# Patient Record
Sex: Female | Born: 1945 | Race: White | Hispanic: No | Smoking: Former smoker
Health system: Southern US, Community
[De-identification: ages and names within clinical notes are randomized; demographics above are authoritative.]

## PROBLEM LIST (undated history)

## (undated) DIAGNOSIS — F329 Major depressive disorder, single episode, unspecified: Secondary | ICD-10-CM

## (undated) DIAGNOSIS — M8580 Other specified disorders of bone density and structure, unspecified site: Secondary | ICD-10-CM

## (undated) DIAGNOSIS — J309 Allergic rhinitis, unspecified: Secondary | ICD-10-CM

## (undated) DIAGNOSIS — J449 Chronic obstructive pulmonary disease, unspecified: Secondary | ICD-10-CM

## (undated) DIAGNOSIS — E785 Hyperlipidemia, unspecified: Secondary | ICD-10-CM

## (undated) DIAGNOSIS — M797 Fibromyalgia: Secondary | ICD-10-CM

## (undated) DIAGNOSIS — N189 Chronic kidney disease, unspecified: Secondary | ICD-10-CM

## (undated) DIAGNOSIS — E559 Vitamin D deficiency, unspecified: Secondary | ICD-10-CM

## (undated) DIAGNOSIS — F32A Depression, unspecified: Secondary | ICD-10-CM

## (undated) DIAGNOSIS — B009 Herpesviral infection, unspecified: Secondary | ICD-10-CM

## (undated) HISTORY — PX: VAGINAL HYSTERECTOMY: SUR661

## (undated) HISTORY — DX: Other specified disorders of bone density and structure, unspecified site: M85.80

## (undated) HISTORY — DX: Chronic obstructive pulmonary disease, unspecified: J44.9

## (undated) HISTORY — DX: Chronic kidney disease, unspecified: N18.9

## (undated) HISTORY — DX: Depression, unspecified: F32.A

## (undated) HISTORY — DX: Major depressive disorder, single episode, unspecified: F32.9

## (undated) HISTORY — DX: Vitamin D deficiency, unspecified: E55.9

## (undated) HISTORY — PX: CHOLECYSTECTOMY: SHX55

## (undated) HISTORY — DX: Herpesviral infection, unspecified: B00.9

## (undated) HISTORY — PX: MOUTH SURGERY: SHX715

## (undated) HISTORY — DX: Allergic rhinitis, unspecified: J30.9

## (undated) HISTORY — DX: Hyperlipidemia, unspecified: E78.5

## (undated) HISTORY — PX: OTHER SURGICAL HISTORY: SHX169

---

## 1998-07-18 ENCOUNTER — Ambulatory Visit (HOSPITAL_COMMUNITY): Admission: RE | Admit: 1998-07-18 | Discharge: 1998-07-18 | Payer: Self-pay | Admitting: Gastroenterology

## 1998-07-22 ENCOUNTER — Ambulatory Visit (HOSPITAL_COMMUNITY): Admission: RE | Admit: 1998-07-22 | Discharge: 1998-07-22 | Payer: Self-pay | Admitting: Gastroenterology

## 1999-06-10 ENCOUNTER — Inpatient Hospital Stay (HOSPITAL_COMMUNITY): Admission: AD | Admit: 1999-06-10 | Discharge: 1999-06-12 | Payer: Self-pay | Admitting: Internal Medicine

## 1999-06-16 ENCOUNTER — Encounter: Payer: Self-pay | Admitting: Emergency Medicine

## 1999-06-16 ENCOUNTER — Emergency Department (HOSPITAL_COMMUNITY): Admission: EM | Admit: 1999-06-16 | Discharge: 1999-06-16 | Payer: Self-pay | Admitting: Emergency Medicine

## 1999-07-13 ENCOUNTER — Encounter: Payer: Self-pay | Admitting: Gastroenterology

## 1999-07-13 ENCOUNTER — Ambulatory Visit (HOSPITAL_COMMUNITY): Admission: RE | Admit: 1999-07-13 | Discharge: 1999-07-13 | Payer: Self-pay | Admitting: Gastroenterology

## 1999-08-13 ENCOUNTER — Encounter: Payer: Self-pay | Admitting: Emergency Medicine

## 1999-08-13 ENCOUNTER — Inpatient Hospital Stay (HOSPITAL_COMMUNITY): Admission: EM | Admit: 1999-08-13 | Discharge: 1999-08-15 | Payer: Self-pay | Admitting: *Deleted

## 1999-08-14 ENCOUNTER — Encounter: Payer: Self-pay | Admitting: Internal Medicine

## 1999-09-22 ENCOUNTER — Ambulatory Visit (HOSPITAL_COMMUNITY): Admission: RE | Admit: 1999-09-22 | Discharge: 1999-09-22 | Payer: Self-pay | Admitting: Internal Medicine

## 1999-09-22 ENCOUNTER — Encounter: Payer: Self-pay | Admitting: Internal Medicine

## 1999-09-25 ENCOUNTER — Encounter (HOSPITAL_BASED_OUTPATIENT_CLINIC_OR_DEPARTMENT_OTHER): Payer: Self-pay | Admitting: General Surgery

## 1999-09-25 ENCOUNTER — Ambulatory Visit (HOSPITAL_COMMUNITY): Admission: RE | Admit: 1999-09-25 | Discharge: 1999-09-26 | Payer: Self-pay | Admitting: General Surgery

## 2001-01-26 ENCOUNTER — Encounter: Admission: RE | Admit: 2001-01-26 | Discharge: 2001-01-26 | Payer: Self-pay | Admitting: Family Medicine

## 2001-01-26 ENCOUNTER — Encounter: Payer: Self-pay | Admitting: Family Medicine

## 2003-08-12 ENCOUNTER — Encounter: Payer: Self-pay | Admitting: Neurology

## 2003-08-12 ENCOUNTER — Encounter: Admission: RE | Admit: 2003-08-12 | Discharge: 2003-08-12 | Payer: Self-pay | Admitting: Neurology

## 2005-08-02 ENCOUNTER — Ambulatory Visit: Payer: Self-pay | Admitting: Internal Medicine

## 2005-08-20 ENCOUNTER — Ambulatory Visit: Payer: Self-pay | Admitting: Internal Medicine

## 2010-05-28 ENCOUNTER — Encounter: Admission: RE | Admit: 2010-05-28 | Discharge: 2010-05-28 | Payer: Self-pay | Admitting: Family Medicine

## 2010-05-29 ENCOUNTER — Ambulatory Visit: Admission: RE | Admit: 2010-05-29 | Discharge: 2010-05-29 | Payer: Self-pay | Admitting: Family Medicine

## 2010-07-23 ENCOUNTER — Encounter (INDEPENDENT_AMBULATORY_CARE_PROVIDER_SITE_OTHER): Payer: Self-pay | Admitting: *Deleted

## 2011-01-14 NOTE — Letter (Signed)
Summary: Colonoscopy Letter  [facility]  [address] [place], [place] [address]   Phone: [phone]  Fax: [phone]      July 23, 2010 MRN: [id number]   [facility] [address] [place], [place]  [address]   Dear Ms. Lard,   According to your medical record, it is time for you to schedule a Colonoscopy. The American Cancer Society recommends this procedure as a method to detect early colon cancer. Patients with a family history of colon cancer, or a personal history of colon polyps or inflammatory bowel disease are at increased risk.  This letter has beeen generated based on the recommendations made at the time of your procedure. If you feel that in your particular situation this may no longer apply, please contact our office.  Please call our office at [phone] to schedule this appointment or to update your records at your earliest convenience.  Thank you for cooperating with [place] to provide you with the very best care possible.   Sincerely,  Hedwig Morton. Juanda Chance, M.D.  [facility] [phone]

## 2012-04-18 ENCOUNTER — Other Ambulatory Visit: Payer: Self-pay | Admitting: Nephrology

## 2012-04-26 ENCOUNTER — Ambulatory Visit
Admission: RE | Admit: 2012-04-26 | Discharge: 2012-04-26 | Disposition: A | Discharge status: Home / Self Care | Payer: BC Managed Care – PPO | Source: Ambulatory Visit | Attending: Nephrology | Admitting: Nephrology

## 2012-04-26 DIAGNOSIS — N183 Chronic kidney disease, stage 3 unspecified: Secondary | ICD-10-CM

## 2012-08-15 ENCOUNTER — Encounter: Payer: Self-pay | Admitting: Internal Medicine

## 2012-09-17 ENCOUNTER — Emergency Department (HOSPITAL_COMMUNITY)
Admission: EM | Admit: 2012-09-17 | Discharge: 2012-09-17 | Disposition: A | Discharge status: Home / Self Care | Payer: BC Managed Care – PPO | Attending: Emergency Medicine | Admitting: Emergency Medicine

## 2012-09-17 ENCOUNTER — Emergency Department (HOSPITAL_COMMUNITY): Payer: BC Managed Care – PPO

## 2012-09-17 ENCOUNTER — Encounter (HOSPITAL_COMMUNITY): Payer: Self-pay | Admitting: *Deleted

## 2012-09-17 DIAGNOSIS — IMO0001 Reserved for inherently not codable concepts without codable children: Secondary | ICD-10-CM | POA: Insufficient documentation

## 2012-09-17 DIAGNOSIS — N200 Calculus of kidney: Secondary | ICD-10-CM | POA: Insufficient documentation

## 2012-09-17 DIAGNOSIS — D72829 Elevated white blood cell count, unspecified: Secondary | ICD-10-CM

## 2012-09-17 DIAGNOSIS — Z79899 Other long term (current) drug therapy: Secondary | ICD-10-CM | POA: Insufficient documentation

## 2012-09-17 DIAGNOSIS — R319 Hematuria, unspecified: Secondary | ICD-10-CM

## 2012-09-17 DIAGNOSIS — N289 Disorder of kidney and ureter, unspecified: Secondary | ICD-10-CM | POA: Insufficient documentation

## 2012-09-17 HISTORY — DX: Fibromyalgia: M79.7

## 2012-09-17 LAB — CBC WITH DIFFERENTIAL/PLATELET
Basophils Relative: 0 % (ref 0–1)
Eosinophils Absolute: 0.1 10*3/uL (ref 0.0–0.7)
Hemoglobin: 14.4 g/dL (ref 12.0–15.0)
Lymphocytes Relative: 14 % (ref 12–46)
Lymphs Abs: 1.6 10*3/uL (ref 0.7–4.0)
MCV: 84.9 fL (ref 78.0–100.0)
Monocytes Relative: 4 % (ref 3–12)
Neutro Abs: 9.7 10*3/uL — ABNORMAL HIGH (ref 1.7–7.7)

## 2012-09-17 LAB — COMPREHENSIVE METABOLIC PANEL
ALT: 12 U/L (ref 0–35)
CO2: 20 mEq/L (ref 19–32)
Calcium: 9.3 mg/dL (ref 8.4–10.5)
Chloride: 107 mEq/L (ref 96–112)
GFR calc Af Amer: 58 mL/min — ABNORMAL LOW (ref >90)
Potassium: 3.8 mEq/L (ref 3.5–5.1)
Sodium: 141 mEq/L (ref 135–145)

## 2012-09-17 LAB — URINALYSIS, ROUTINE W REFLEX MICROSCOPIC
Glucose, UA: NEGATIVE mg/dL
Nitrite: NEGATIVE
Protein, ur: NEGATIVE mg/dL

## 2012-09-17 LAB — URINE MICROSCOPIC-ADD ON

## 2012-09-17 MED ORDER — KETOROLAC TROMETHAMINE 30 MG/ML IJ SOLN
30.0000 mg | Freq: Once | INTRAMUSCULAR | Status: AC
  Administered 2012-09-17: 30 mg via INTRAVENOUS
  Filled 2012-09-17: qty 1

## 2012-09-17 MED ORDER — KETOROLAC TROMETHAMINE 10 MG PO TABS: 10.0000 mg | ORAL_TABLET | Freq: Four times a day (QID) | ORAL | Status: AC | PRN

## 2012-09-17 MED ORDER — ONDANSETRON HCL 4 MG/2ML IJ SOLN
4.0000 mg | Freq: Once | INTRAMUSCULAR | Status: AC
  Administered 2012-09-17: 4 mg via INTRAVENOUS

## 2012-09-17 MED ORDER — ONDANSETRON HCL 4 MG/2ML IJ SOLN
INTRAMUSCULAR | Status: AC
  Administered 2012-09-17: 4 mg via INTRAVENOUS
  Filled 2012-09-17: qty 2

## 2012-09-17 MED ORDER — TAMSULOSIN HCL 0.4 MG PO CAPS: 0.4000 mg | ORAL_CAPSULE | Freq: Every day | ORAL | Status: AC

## 2012-09-17 NOTE — ED Provider Notes (Signed)
4:48 PM BP 156/71  Pulse 73  Temp 98.1 F (36.7 C) (Oral)  Resp 22  SpO2 95% Assumed care of the patient from PA [employer]. Patient with chief complaint of abdominal pain. CT scan shows nephrolithiasis. Explained the findings to the patient. She is an established patient of Dr. Allena Katz. She will followup with him. I'm discharging her with portal, Flomax, and have instructed her to drink plenty of fluids. Have also given her information on dietary changes for kidney stones. All questions answered fully. Return precautions discussed. patient agrees with plan.   Arthor Captain, PA-C 09/17/12 1650

## 2012-09-17 NOTE — ED Provider Notes (Signed)
History     CSN: [id number]  Arrival date & time 09/17/12  0941   First MD Initiated Contact with Patient 09/17/12 1243      Chief Complaint  Patient presents with  . Emesis  . Abdominal Pain    (Consider location/radiation/quality/duration/timing/severity/associated sxs/prior treatment) HPI Comments: Patient presented with LLQ, 9/10, without radiation or transmission. Associated symptoms include nausea and vomiting. Of note patient was told by her primary care that her kidney function was getting worse and that her urine had crystals. He also informed her that she may end up with kidney stones. Denies fever or chills. Denies hematemesis or diarrhea, last bowel movement was normal.   Patient is a 66 y.o. female presenting with abdominal pain. The history is provided by the patient. A language interpreter was used.  Abdominal Pain The primary symptoms of the illness include abdominal pain, nausea and vomiting. The primary symptoms of the illness do not include fever, diarrhea or dysuria.  Symptoms associated with the illness do not include chills, constipation, urgency, frequency or back pain.    Past Medical History  Diagnosis Date  . Fibromyalgia   . Renal disorder     History reviewed. No pertinent past surgical history.  History reviewed. No pertinent family history.  History  Substance Use Topics  . Smoking status: Not on file  . Smokeless tobacco: Not on file  . Alcohol Use:     OB History    Grav Para Term Preterm Abortions TAB SAB Ect Mult Living                  Review of Systems  Constitutional: Negative for fever and chills.  Gastrointestinal: Positive for nausea, vomiting and abdominal pain. Negative for diarrhea, constipation and blood in stool.  Genitourinary: Negative for dysuria, urgency, frequency and flank pain.  Musculoskeletal: Negative for back pain.    Allergies  Augmentin; Fish oil; Flexeril; Lovaza; Minocycline; Percocet; and Sulfa  antibiotics  Home Medications   Current Outpatient Rx  Name Route Sig Dispense Refill  . ALPRAZOLAM 0.5 MG PO TABS Oral Take 0.5 mg by mouth at bedtime as needed. anxiety    . DIPHENHYDRAMINE HCL (SLEEP) 25 MG PO TABS Oral Take 25 mg by mouth daily as needed. allergies    . ERGOCALCIFEROL 50000 UNITS PO CAPS Oral Take 50,000 Units by mouth once a week. on Fridays    . ETODOLAC 400 MG PO TABS Oral Take 400 mg by mouth daily.    Marland Kitchen EZETIMIBE-SIMVASTATIN 10-40 MG PO TABS Oral Take 1 tablet by mouth at bedtime.    . FENOFIBRATE 145 MG PO TABS Oral Take 145 mg by mouth daily.    Marland Kitchen FLUTICASONE PROPIONATE 50 MCG/ACT NA SUSP Nasal Place 2 sprays into the nose daily.    Marland Kitchen HYDROCODONE-ACETAMINOPHEN 5-500 MG PO TABS Oral Take 1 tablet by mouth every 6 (six) hours as needed. For pain    . ADULT MULTIVITAMIN W/MINERALS CH Oral Take 1 tablet by mouth daily.    Marland Kitchen OMEPRAZOLE 20 MG PO CPDR Oral Take 20 mg by mouth daily.    Marland Kitchen PREGABALIN 75 MG PO CAPS Oral Take 75 mg by mouth 2 (two) times daily.    . SERTRALINE HCL 100 MG PO TABS Oral Take 100 mg by mouth daily.    Marland Kitchen VITAMIN B-12 1000 MCG PO TABS Oral Take 1,000 mcg by mouth daily.    Marland Kitchen ZOLPIDEM TARTRATE 10 MG PO TABS Oral Take 10 mg by mouth at  bedtime as needed. For sleep      BP 156/71  Pulse 73  Temp 98.1 F (36.7 C) (Oral)  Resp 22  SpO2 95%  Physical Exam  Nursing note and vitals reviewed. Constitutional: She appears well-developed and well-nourished. She appears distressed.  HENT:  Head: Normocephalic and atraumatic.  Mouth/Throat: Oropharynx is clear and moist.  Eyes: Conjunctivae normal and EOM are normal. No scleral icterus.  Neck: Normal range of motion. Neck supple.  Cardiovascular: Normal rate, regular rhythm and normal heart sounds.   Pulmonary/Chest: Effort normal and breath sounds normal.  Abdominal: Soft. Bowel sounds are normal. She exhibits no distension and no mass. There is tenderness. There is no rebound and no guarding.        Patient tender to palpation of the LLQ.  Musculoskeletal:       CVA tenderness on left side only.  Neurological: She is alert.  Skin: Skin is warm and dry.    ED Course  Procedures (including critical care time)  Labs Reviewed  CBC WITH DIFFERENTIAL - Abnormal; Notable for the following:    WBC 11.9 (*)     Neutrophils Relative 82 (*)     Neutro Abs 9.7 (*)     All other components within normal limits  COMPREHENSIVE METABOLIC PANEL - Abnormal; Notable for the following:    Glucose, Bld 177 (*)     Creatinine, Ser 1.14 (*)     GFR calc non Af Amer 50 (*)     GFR calc Af Amer 58 (*)     All other components within normal limits  URINALYSIS, ROUTINE W REFLEX MICROSCOPIC - Abnormal; Notable for the following:    APPearance CLOUDY (*)     Hgb urine dipstick LARGE (*)     Bilirubin Urine MODERATE (*)     Leukocytes, UA SMALL (*)     All other components within normal limits  URINE MICROSCOPIC-ADD ON - Abnormal; Notable for the following:    Squamous Epithelial / LPF FEW (*)     Bacteria, UA FEW (*)     Crystals CA OXALATE CRYSTALS (*)     All other components within normal limits   Ct Abdomen Pelvis Wo Contrast  09/17/2012  *RADIOLOGY REPORT*  Clinical Data: Left lower quadrant abdominal pain.  Vomiting began yesterday evening.  Pain is increased this morning.  History of kidney problems.  Shortness of breath.  Hematuria.  Flank pain.  CT ABDOMEN AND PELVIS WITHOUT CONTRAST  Technique:  Multidetector CT imaging of the abdomen and pelvis was performed following the standard protocol without intravenous contrast.  Comparison: Renal ultrasound 04/26/2012  Findings: The lung bases show atelectasis bilaterally, left greater than right.  There is mild left hydroureter secondary to a 3 mm calculus at the ureteral vesicle junction.  There is moderate stranding surrounding the left kidney, consistent with obstruction.  No focal abnormality identified within the liver, spleen, pancreas,  adrenal glands, or right kidney.  No intrarenal or ureteral calculi identified on the right.  The gallbladder is surgically absent.  Hiatal hernia is present.  Otherwise, the stomach has a normal appearance.  Small bowel loops are normal in caliber and wall thickness. The appendix is well seen and has a normal appearance. Colonic loops contain multiple diverticula.  No associated inflammation to indicate diverticulitis however.  There is atherosclerotic calcification of the abdominal aorta.  No aneurysm. Visualized osseous structures have a normal appearance.  The patient has had hysterectomy.  No adnexal mass or  free pelvic fluid.  IMPRESSION:  1.  Calculus in the distal left ureter measures 3 mm in diameter. There is resulting moderate obstruction and perinephric stranding on the left. 2.  Diverticulosis without evidence for acute diverticulitis. 3.  Hiatal hernia.   Original Report Authenticated By: Patterson Hammersmith, M.D.      1. Nephrolithiasis   2. Hematuria   3. Leukocytosis   4. Renal insufficiency       MDM  Patient presented with LLQ pain, nausea, and vomiting. Patient given Toradol and fluidswith improvement. CBC: leukocytosis CMP: renal insufficieny UA: hematuria and calcium oxalate crystals. CT abdomen pelvis w/o contrast: remarkable for 3mm stone with moderate obstruction and perinephric stranding. Patient care assumed by Arthor Captain, PA-C. She will inform and discharge patient and give return precautions. No red flags for pyelonephritis or perinephric abscess.       Pixie Casino, PA-C 09/17/12 1641

## 2012-09-17 NOTE — ED Notes (Signed)
Pt refused chest x-ray

## 2012-09-17 NOTE — ED Notes (Addendum)
Pt in c/o LLQ abd pain and vomiting that started yesterday evening, pain increased this am. Pt states she has a history of kidney problems. Pt also c/o shortness of breath since this am, pt speaking in full sentences without difficulty.

## 2012-09-18 NOTE — ED Provider Notes (Signed)
Medical screening examination/treatment/procedure(s) were conducted as a shared visit with non-physician practitioner(s) and myself.  I personally evaluated the patient during the encounter  [name], MD 09/18/12 0047 

## 2012-09-18 NOTE — ED Provider Notes (Signed)
Medical screening examination/treatment/procedure(s) were performed by non-physician practitioner and as supervising physician I was immediately available for consultation/collaboration.   Misbah Hornaday III, MD 09/18/12 0758 

## 2014-11-22 ENCOUNTER — Emergency Department (HOSPITAL_COMMUNITY)
Admission: EM | Admit: 2014-11-22 | Discharge: 2014-11-22 | Disposition: A | Discharge status: Home / Self Care | Payer: Medicare Other | Attending: Emergency Medicine | Admitting: Emergency Medicine

## 2014-11-22 ENCOUNTER — Emergency Department (HOSPITAL_COMMUNITY): Payer: Medicare Other

## 2014-11-22 ENCOUNTER — Encounter (HOSPITAL_COMMUNITY): Payer: Self-pay | Admitting: *Deleted

## 2014-11-22 ENCOUNTER — Other Ambulatory Visit: Payer: Self-pay

## 2014-11-22 DIAGNOSIS — Z7951 Long term (current) use of inhaled steroids: Secondary | ICD-10-CM | POA: Diagnosis not present

## 2014-11-22 DIAGNOSIS — Y9289 Other specified places as the place of occurrence of the external cause: Secondary | ICD-10-CM | POA: Diagnosis not present

## 2014-11-22 DIAGNOSIS — Z792 Long term (current) use of antibiotics: Secondary | ICD-10-CM | POA: Insufficient documentation

## 2014-11-22 DIAGNOSIS — Z87448 Personal history of other diseases of urinary system: Secondary | ICD-10-CM | POA: Diagnosis not present

## 2014-11-22 DIAGNOSIS — Z87891 Personal history of nicotine dependence: Secondary | ICD-10-CM | POA: Diagnosis not present

## 2014-11-22 DIAGNOSIS — R55 Syncope and collapse: Secondary | ICD-10-CM | POA: Diagnosis not present

## 2014-11-22 DIAGNOSIS — Z8739 Personal history of other diseases of the musculoskeletal system and connective tissue: Secondary | ICD-10-CM | POA: Insufficient documentation

## 2014-11-22 DIAGNOSIS — Z79899 Other long term (current) drug therapy: Secondary | ICD-10-CM | POA: Insufficient documentation

## 2014-11-22 DIAGNOSIS — W1839XA Other fall on same level, initial encounter: Secondary | ICD-10-CM | POA: Diagnosis not present

## 2014-11-22 DIAGNOSIS — Y9389 Activity, other specified: Secondary | ICD-10-CM | POA: Insufficient documentation

## 2014-11-22 DIAGNOSIS — R42 Dizziness and giddiness: Secondary | ICD-10-CM | POA: Insufficient documentation

## 2014-11-22 DIAGNOSIS — M25552 Pain in left hip: Secondary | ICD-10-CM

## 2014-11-22 DIAGNOSIS — Y998 Other external cause status: Secondary | ICD-10-CM | POA: Insufficient documentation

## 2014-11-22 DIAGNOSIS — S75202A Unspecified injury of greater saphenous vein at hip and thigh level, left leg, initial encounter: Secondary | ICD-10-CM | POA: Insufficient documentation

## 2014-11-22 LAB — BASIC METABOLIC PANEL
Anion gap: 15 (ref 5–15)
BUN: 13 mg/dL (ref 6–23)
CO2: 17 meq/L — AB (ref 19–32)
Calcium: 8.7 mg/dL (ref 8.4–10.5)
Chloride: 109 mEq/L (ref 96–112)
Creatinine, Ser: 1 mg/dL (ref 0.50–1.10)
GFR calc Af Amer: 66 mL/min — ABNORMAL LOW (ref >90)
GFR, EST NON AFRICAN AMERICAN: 57 mL/min — AB (ref >90)
GLUCOSE: 110 mg/dL — AB (ref 70–99)
Potassium: 4.2 mEq/L (ref 3.7–5.3)
Sodium: 141 mEq/L (ref 137–147)

## 2014-11-22 LAB — CBC WITH DIFFERENTIAL/PLATELET
Basophils Absolute: 0 10*3/uL (ref 0.0–0.1)
Basophils Relative: 0 % (ref 0–1)
EOS ABS: 0.1 10*3/uL (ref 0.0–0.7)
Eosinophils Relative: 1 % (ref 0–5)
HEMATOCRIT: 42.2 % (ref 36.0–46.0)
HEMOGLOBIN: 13.8 g/dL (ref 12.0–15.0)
LYMPHS ABS: 2.7 10*3/uL (ref 0.7–4.0)
LYMPHS PCT: 35 % (ref 12–46)
MCH: 28.5 pg (ref 26.0–34.0)
MCHC: 32.7 g/dL (ref 30.0–36.0)
MCV: 87.2 fL (ref 78.0–100.0)
MONOS PCT: 5 % (ref 3–12)
Monocytes Absolute: 0.4 10*3/uL (ref 0.1–1.0)
Neutro Abs: 4.6 10*3/uL (ref 1.7–7.7)
Neutrophils Relative %: 59 % (ref 43–77)
Platelets: 267 10*3/uL (ref 150–400)
RBC: 4.84 MIL/uL (ref 3.87–5.11)
RDW: 13.6 % (ref 11.5–15.5)
WBC: 7.8 10*3/uL (ref 4.0–10.5)

## 2014-11-22 LAB — TROPONIN I

## 2014-11-22 MED ORDER — METHOCARBAMOL 500 MG PO TABS: 1000.0000 mg | ORAL_TABLET | Freq: Four times a day (QID) | ORAL | Status: DC | PRN

## 2014-11-22 MED ORDER — ONDANSETRON HCL 4 MG/2ML IJ SOLN
4.0000 mg | Freq: Once | INTRAMUSCULAR | Status: AC
  Administered 2014-11-22: 4 mg via INTRAVENOUS
  Filled 2014-11-22: qty 2

## 2014-11-22 MED ORDER — MORPHINE SULFATE 4 MG/ML IJ SOLN
4.0000 mg | Freq: Once | INTRAMUSCULAR | Status: AC
  Administered 2014-11-22: 4 mg via INTRAVENOUS
  Filled 2014-11-22: qty 1

## 2014-11-22 MED ORDER — METHYLPREDNISOLONE SODIUM SUCC 125 MG IJ SOLR
125.0000 mg | Freq: Once | INTRAMUSCULAR | Status: AC
  Administered 2014-11-22: 125 mg via INTRAVENOUS
  Filled 2014-11-22: qty 2

## 2014-11-22 MED ORDER — METHOCARBAMOL 500 MG PO TABS
500.0000 mg | ORAL_TABLET | Freq: Once | ORAL | Status: AC
  Administered 2014-11-22: 500 mg via ORAL
  Filled 2014-11-22: qty 1

## 2014-11-22 NOTE — ED Notes (Signed)
[facility] Expected date:  Expected time:  Means of arrival:  Comments: EMS-hip pain X 6 days

## 2014-11-22 NOTE — Discharge Instructions (Signed)

## 2014-11-22 NOTE — ED Provider Notes (Signed)
CSN: [id number]     Arrival date & time 11/22/14  1522 History   First MD Initiated Contact with Patient 11/22/14 1548     Chief Complaint  Patient presents with  . Hip Pain  . Near Syncope     (Consider location/radiation/quality/duration/timing/severity/associated sxs/prior Treatment) HPI   Patricia Sloan is a 68 y.o. female with past medical history of fibromyalgia, IBS, brought in by EMS complaining of multiple episodes of presyncopal sensation when the pain in her left hip is severe. Patient states that she had a small fall 10 days ago, she developed left hip pain 7 days ago, pain is severe, 8 out of 10, exacerbated by movement and palpation. She is taking Xanax, Lyrica, Vicodin at home with little relief. Patient is former smoker, denies family history of cardiac disease, denies hypertension, hyperlipidemia and diabetes.  Past Medical History  Diagnosis Date  . Fibromyalgia   . Renal disorder    History reviewed. No pertinent past surgical history. History reviewed. No pertinent family history. History  Substance Use Topics  . Smoking status: Former Games developer  . Smokeless tobacco: Not on file  . Alcohol Use: No   OB History    No data available     Review of Systems  10 systems reviewed and found to be negative, except as noted in the HPI.  Allergies  Augmentin; Fish oil; Flexeril; Lovaza; Minocycline; Percocet; and Sulfa antibiotics  Home Medications   Prior to Admission medications   Medication Sig Start Date End Date Taking? Authorizing Provider  albuterol (PROVENTIL HFA;VENTOLIN HFA) 108 (90 BASE) MCG/ACT inhaler Inhale 2 puffs into the lungs every 6 (six) hours as needed for wheezing or shortness of breath.   Yes Historical Provider, MD  ALPRAZolam Prudy Feeler) 0.5 MG tablet Take 0.5 mg by mouth at bedtime as needed. anxiety   Yes Historical Provider, MD  azithromycin (ZITHROMAX) 250 MG tablet Take 250 mg by mouth daily. For 5 days   Yes Historical Provider, MD   diphenhydrAMINE (SOMINEX) 25 MG tablet Take 25 mg by mouth daily as needed. allergies   Yes Historical Provider, MD  ergocalciferol (VITAMIN D2) 50000 UNITS capsule Take 50,000 Units by mouth once a week. on Fridays   Yes Historical Provider, MD  fenofibrate (TRICOR) 145 MG tablet Take 145 mg by mouth daily.   Yes Historical Provider, MD  fluticasone (FLONASE) 50 MCG/ACT nasal spray Place 2 sprays into the nose daily.   Yes Historical Provider, MD  Fluticasone-Salmeterol (ADVAIR) 100-50 MCG/DOSE AEPB Inhale 1 puff into the lungs 2 (two) times daily.   Yes Historical Provider, MD  HYDROcodone-acetaminophen (VICODIN) 5-500 MG per tablet Take 1 tablet by mouth every 6 (six) hours as needed. For pain   Yes Historical Provider, MD  Multiple Vitamin (MULTIVITAMIN WITH MINERALS) TABS Take 1 tablet by mouth daily.   Yes Historical Provider, MD  omeprazole (PRILOSEC) 20 MG capsule Take 20 mg by mouth daily.   Yes Historical Provider, MD  pregabalin (LYRICA) 75 MG capsule Take 75 mg by mouth 2 (two) times daily.   Yes Historical Provider, MD  sertraline (ZOLOFT) 100 MG tablet Take 100 mg by mouth daily.   Yes Historical Provider, MD  simvastatin (ZOCOR) 40 MG tablet Take 40 mg by mouth daily.   Yes Historical Provider, MD  Tamsulosin HCl (FLOMAX) 0.4 MG CAPS Take 1 capsule (0.4 mg total) by mouth daily. Discontinue after stone passes. 09/17/12  Yes Arthor Captain, PA-C  vitamin B-12 (CYANOCOBALAMIN) 1000 MCG tablet Take  1,000 mcg by mouth daily.   Yes Historical Provider, MD  zolpidem (AMBIEN) 10 MG tablet Take 10 mg by mouth at bedtime as needed. For sleep   Yes Historical Provider, MD  ketorolac (TORADOL) 10 MG tablet Take 1 tablet (10 mg total) by mouth every 6 (six) hours as needed for pain (do not exceed 40 mg/day ( no greater than 4 pills/day)). Patient not taking: Reported on 11/22/2014 09/17/12   Arthor CaptainAbigail Harris, PA-C  methocarbamol (ROBAXIN) 500 MG tablet Take 2 tablets (1,000 mg total) by mouth 4  (four) times daily as needed (Pain). 11/22/14   Roxine Whittinghill, PA-C   BP 144/58 mmHg  Pulse 63  Temp(Src) 98.2 F (36.8 C) (Oral)  Resp 16  SpO2 93% Physical Exam  Constitutional: She is oriented to person, place, and time. She appears well-developed and well-nourished. No distress.  HENT:  Head: Normocephalic and atraumatic.  Mouth/Throat: Oropharynx is clear and moist.  Eyes: Conjunctivae and EOM are normal. Pupils are equal, round, and reactive to light.  Neck: Normal range of motion.  Cardiovascular: Normal rate, regular rhythm and intact distal pulses.   Pulmonary/Chest: Effort normal and breath sounds normal. No stridor. No respiratory distress. She has no wheezes. She has no rales. She exhibits no tenderness.  Abdominal: Soft. She exhibits no distension and no mass. There is no tenderness. There is no rebound and no guarding.  Musculoskeletal: Normal range of motion. She exhibits no edema or tenderness.  No point tenderness to percussion of lumbar spinal processes.  No TTP or paraspinal muscular spasm. Strength is 5 out of 5 to bilateral lower extremities at hip and knee; extensor hallucis longus 5 out of 5. Ankle strength 5 out of 5, no clonus, neurovascularly intact. No saddle anaesthesia. Patellar reflexes are 2+ bilaterally.    Straight leg raise is positive on the ipsilateral (left side at 40, negative on the contralateral side.   Neurological: She is alert and oriented to person, place, and time.  Psychiatric: She has a normal mood and affect.  Nursing note and vitals reviewed.   ED Course  Procedures (including critical care time) Labs Review Labs Reviewed  BASIC METABOLIC PANEL - Abnormal; Notable for the following:    CO2 17 (*)    Glucose, Bld 110 (*)    GFR calc non Af Amer 57 (*)    GFR calc Af Amer 66 (*)    All other components within normal limits  CBC WITH DIFFERENTIAL  TROPONIN I    Imaging Review Dg Chest 2 View  11/22/2014   CLINICAL DATA:   Cough.  EXAM: CHEST  2 VIEW  COMPARISON:  May 28, 2010.  FINDINGS: The heart size and mediastinal contours are within normal limits. No pneumothorax or pleural effusion is noted. Left lung is clear. Minimal right basilar subsegmental atelectasis is noted. The visualized skeletal structures are unremarkable.  IMPRESSION: Minimal right basilar subsegmental atelectasis.   Electronically Signed   By: Roque LiasJames  Green M.D.   On: 11/22/2014 17:14   Dg Hip Complete Left  11/22/2014   CLINICAL DATA:  Severe left hip pain.  Pain for 4 days.  No injury.  EXAM: LEFT HIP - COMPLETE 2+ VIEW  COMPARISON:  CT 09/17/2012  FINDINGS: Pelvic bony ring is intact. Left hip is located without acute fracture. Multiple calcifications in the pelvis could represent phleboliths. No significant degenerative disease in the left hip.  IMPRESSION: No acute bone abnormality to the pelvis or left hip.   Electronically Signed  By: Richarda OverlieAdam  Henn M.D.   On: 11/22/2014 17:12     EKG Interpretation None     EKG shows normal sinus rhythm at 66 bpm with normal axis, intervals T wave anterior lateral leads. MDM   Final diagnoses:  Light-headed feeling  Hip pain, acute, left   Filed Vitals:   11/22/14 1530 11/22/14 2142  BP: 127/57 144/58  Pulse: 68 63  Temp: 98.8 F (37.1 C) 98.2 F (36.8 C)  TempSrc: Oral Oral  Resp:  16  SpO2: 93% 93%    Medications  morphine 4 MG/ML injection 4 mg (4 mg Intravenous Given 11/22/14 1716)  ondansetron (ZOFRAN) injection 4 mg (4 mg Intravenous Given 11/22/14 1716)  methylPREDNISolone sodium succinate (SOLU-MEDROL) 125 mg/2 mL injection 125 mg (125 mg Intravenous Given 11/22/14 1716)  methocarbamol (ROBAXIN) tablet 500 mg (500 mg Oral Given 11/22/14 1735)  morphine 4 MG/ML injection 4 mg (4 mg Intravenous Given 11/22/14 2100)  ondansetron (ZOFRAN) injection 4 mg (4 mg Intravenous Given 11/22/14 2100)    Silvio PateMarion H Kamiya is a pleasant 68 y.o. female presenting with severe left-sided sciatic  radiculopathy. Patient states pain is so severe that she feels lightheaded I think that her lightheaded sensation is likely secondary to pain. A presyncope workup was initiated out of an abundance of precautions. Patient's EKG is nonischemic, there are T wave inversions, we do not have a prior to compare it to.   Patient states that she had T-wave inversions in a prior cardiac workup which she had in the remote past. Patient states she will follow closely with her PCP, we've had an extensive discussion of return precautions.  This is a shared visit with the attending physician who personally evaluated the patient and agrees with the care plan.   Evaluation does not show pathology that would require ongoing emergent intervention or inpatient treatment. Pt is hemodynamically stable and mentating appropriately. Discussed findings and plan with patient/guardian, who agrees with care plan. All questions answered. Return precautions discussed and outpatient follow up given.   Discharge Medication List as of 11/22/2014  9:39 PM    START taking these medications   Details  methocarbamol (ROBAXIN) 500 MG tablet Take 2 tablets (1,000 mg total) by mouth 4 (four) times daily as needed (Pain)., Starting 11/22/2014, Until Discontinued, Print             Wynetta Emeryicole Magaly Pollina, PA-C 11/23/14 0205  Warnell Foresterrey Wofford, MD 11/24/14 [phone]

## 2014-11-22 NOTE — ED Notes (Signed)
Patient called EMS with a complaint of left hip pain for 7 days with feelings of syncope related to pain. Patient is under her doctor's care currently for other non-related complaints.

## 2014-12-19 ENCOUNTER — Ambulatory Visit: Payer: BC Managed Care – PPO | Admitting: Cardiovascular Disease

## 2014-12-23 ENCOUNTER — Encounter: Payer: Self-pay | Admitting: *Deleted

## 2014-12-23 NOTE — Progress Notes (Signed)
Patient ID: Patricia Sloan, female   DOB: [birth year], 69 y.o.   MRN: [id number]    69 y.o. referred by Shaune Pollack for abnormal ECG      ROS: Denies fever, malais, weight loss, blurry vision, decreased visual acuity, cough, sputum, SOB, hemoptysis, pleuritic pain, palpitaitons, heartburn, abdominal pain, melena, lower extremity edema, claudication, or rash.  All other systems reviewed and negative   General: Affect appropriate Healthy:  appears stated age HEENT: normal Neck supple with no adenopathy JVP normal no bruits no thyromegaly Lungs clear with no wheezing and good diaphragmatic motion Heart:  S1/S2 no murmur,rub, gallop or click PMI normal Abdomen: benighn, BS positve, no tenderness, no AAA no bruit.  No HSM or HJR Distal pulses intact with no bruits No edema Neuro non-focal Skin warm and dry No muscular weakness  Medications Current Outpatient Prescriptions  Medication Sig Dispense Refill  . albuterol (PROVENTIL HFA;VENTOLIN HFA) 108 (90 BASE) MCG/ACT inhaler Inhale 2 puffs into the lungs every 6 (six) hours as needed for wheezing or shortness of breath.    . ALPRAZolam (XANAX) 0.5 MG tablet Take 0.5 mg by mouth at bedtime as needed. anxiety    . azithromycin (ZITHROMAX) 250 MG tablet Take 250 mg by mouth daily. For 5 days    . diphenhydrAMINE (SOMINEX) 25 MG tablet Take 25 mg by mouth daily as needed. allergies    . ergocalciferol (VITAMIN D2) 50000 UNITS capsule Take 50,000 Units by mouth once a week. on Fridays    . fenofibrate (TRICOR) 145 MG tablet Take 145 mg by mouth daily.    . fluticasone (FLONASE) 50 MCG/ACT nasal spray Place 2 sprays into the nose daily.    . Fluticasone-Salmeterol (ADVAIR) 100-50 MCG/DOSE AEPB Inhale 1 puff into the lungs 2 (two) times daily.    Marland Kitchen HYDROcodone-acetaminophen (VICODIN) 5-500 MG per tablet Take 1 tablet by mouth every 6 (six) hours as needed. For pain    . ketorolac (TORADOL) 10 MG tablet Take 1 tablet (10 mg total) by mouth  every 6 (six) hours as needed for pain (do not exceed 40 mg/day ( no greater than 4 pills/day)). (Patient not taking: Reported on 11/22/2014) 40 tablet 0  . methocarbamol (ROBAXIN) 500 MG tablet Take 2 tablets (1,000 mg total) by mouth 4 (four) times daily as needed (Pain). 20 tablet 0  . Multiple Vitamin (MULTIVITAMIN WITH MINERALS) TABS Take 1 tablet by mouth daily.    Marland Kitchen omeprazole (PRILOSEC) 20 MG capsule Take 20 mg by mouth daily.    . pregabalin (LYRICA) 75 MG capsule Take 75 mg by mouth 2 (two) times daily.    . sertraline (ZOLOFT) 100 MG tablet Take 100 mg by mouth daily.    . simvastatin (ZOCOR) 40 MG tablet Take 40 mg by mouth daily.    . Tamsulosin HCl (FLOMAX) 0.4 MG CAPS Take 1 capsule (0.4 mg total) by mouth daily. Discontinue after stone passes. 20 capsule 0  . vitamin B-12 (CYANOCOBALAMIN) 1000 MCG tablet Take 1,000 mcg by mouth daily.    Marland Kitchen zolpidem (AMBIEN) 10 MG tablet Take 10 mg by mouth at bedtime as needed. For sleep     No current facility-administered medications for this visit.    Allergies Augmentin; Fish oil; Flexeril; Lovaza; Minocycline; Percocet; and Sulfa antibiotics  Family History: Family History  Problem Relation Age of Onset  . Mental illness Mother   . CAD Mother   . Heart attack Father   . Diabetes Father   . CAD Father   .  Hypertension Sister   . Hypothyroidism Sister   . Glaucoma Sister     Social History: History   Social History  . Marital Status: Divorced    Spouse Name: N/A    Number of Children: N/A  . Years of Education: N/A   Occupational History  . Not on file.   Social History Main Topics  . Smoking status: Former Games developermoker  . Smokeless tobacco: Not on file  . Alcohol Use: No  . Drug Use: Not on file  . Sexual Activity: Not on file   Other Topics Concern  . Not on file   Social History Narrative    Past Surgical History  Procedure Laterality Date  . Vaginal hysterectomy    . Cholecystectomy    . Right foot surgery     . Mouth surgery      Past Medical History  Diagnosis Date  . Fibromyalgia   . Renal disorder   . HLD (hyperlipidemia)   . CKD (chronic kidney disease)   . Depression   . Vitamin D deficiency   . Osteopenia   . Allergic rhinitis   . Herpes     Electrocardiogram:  11/22/14  SR rate 66 anterior lateral T wave inversions  Assessment and Plan

## 2014-12-24 ENCOUNTER — Encounter: Payer: BC Managed Care – PPO | Admitting: Cardiovascular Disease

## 2014-12-27 ENCOUNTER — Telehealth: Payer: Self-pay | Admitting: Cardiology

## 2014-12-27 NOTE — Telephone Encounter (Signed)
Received [facility] (Dr Shaune Pollackonna Gates) for appointment with Dr Antoine PocheHochrein on 01/16/15.  [facility] for Dr Hochrein's schedule on 01/16/15. lp

## 2015-01-16 ENCOUNTER — Encounter: Payer: Self-pay | Admitting: Cardiology

## 2015-01-16 ENCOUNTER — Ambulatory Visit (INDEPENDENT_AMBULATORY_CARE_PROVIDER_SITE_OTHER): Payer: 59 | Admitting: Cardiology

## 2015-01-16 VITALS — BP 154/74 | HR 84 | Ht 65.0 in | Wt 201.5 lb

## 2015-01-16 DIAGNOSIS — R9431 Abnormal electrocardiogram [ECG] [EKG]: Secondary | ICD-10-CM

## 2015-01-16 NOTE — Progress Notes (Signed)
Cardiology Office Note   Date:  01/16/2015   ID:  Patricia, Sloan 06-03-1946, MRN [id number]  PCP:  Hollice Espy, MD  Cardiologist:   Rollene Rotunda, MD   No chief complaint on file.     History of Present Illness: Patricia Sloan is a 69 y.o. female who presents for evaluation of an abnormal EKG. She did have a cardiac catheterization several years ago and I was able to review this result. She had minimal coronary plaque. She's had an abnormal EKG with T-wave inversions in the past although I don't have an old one for comparison except for one done in December. This was noted in the emergency room when she was there with thigh pain. She's actually been diagnosed with back problems causing her leg pain and she's about to get injections for this apparently. She otherwise doesn't have a cardiac history. She's been somewhat limited by her back pain. She also has fibromyalgia and chronic fatigue which limits her activities. In the past she did have chest pain that was attributed to the fibromyalgia and she was treated with Vioxx.  She subsequently is treated with Lodine but developed some renal insufficiency with this. She is now on Lyrica and she doesn't report any chest pain, neck or arm pain. She doesn't report any palpitations, presyncope or syncope. She has no PND or orthopnea.  Past Medical History  Diagnosis Date  . Fibromyalgia   . Renal disorder   . HLD (hyperlipidemia)   . CKD (chronic kidney disease)   . Depression   . Vitamin D deficiency   . Osteopenia   . Allergic rhinitis   . Herpes     Past Surgical History  Procedure Laterality Date  . Vaginal hysterectomy    . Cholecystectomy    . Right foot surgery    . Mouth surgery       Current Outpatient Prescriptions  Medication Sig Dispense Refill  . albuterol (PROVENTIL HFA;VENTOLIN HFA) 108 (90 BASE) MCG/ACT inhaler Inhale 2 puffs into the lungs every 6 (six) hours as needed for wheezing or shortness of  breath.    . ALPRAZolam (XANAX) 0.5 MG tablet Take 0.5 mg by mouth at bedtime as needed. anxiety    . diphenhydrAMINE (SOMINEX) 25 MG tablet Take 25 mg by mouth daily as needed. allergies    . ergocalciferol (VITAMIN D2) 50000 UNITS capsule Take 50,000 Units by mouth once a week. on Fridays    . fenofibrate (TRICOR) 145 MG tablet Take 145 mg by mouth daily.    . fluticasone (FLONASE) 50 MCG/ACT nasal spray Place 2 sprays into the nose daily.    . Fluticasone-Salmeterol (ADVAIR) 100-50 MCG/DOSE AEPB Inhale 1 puff into the lungs 2 (two) times daily.    Marland Kitchen HYDROcodone-acetaminophen (VICODIN) 5-500 MG per tablet Take 1 tablet by mouth every 6 (six) hours as needed. For pain    . ketorolac (TORADOL) 10 MG tablet Take 1 tablet (10 mg total) by mouth every 6 (six) hours as needed for pain (do not exceed 40 mg/day ( no greater than 4 pills/day)). 40 tablet 0  . Multiple Vitamin (MULTIVITAMIN WITH MINERALS) TABS Take 1 tablet by mouth daily.    Marland Kitchen omeprazole (PRILOSEC) 20 MG capsule Take 20 mg by mouth daily.    . pregabalin (LYRICA) 75 MG capsule Take 75 mg by mouth 2 (two) times daily.    . sertraline (ZOLOFT) 100 MG tablet Take 100 mg by mouth daily.    . simvastatin (  ZOCOR) 40 MG tablet Take 40 mg by mouth daily.    . Tamsulosin HCl (FLOMAX) 0.4 MG CAPS Take 1 capsule (0.4 mg total) by mouth daily. Discontinue after stone passes. 20 capsule 0  . vitamin B-12 (CYANOCOBALAMIN) 1000 MCG tablet Take 1,000 mcg by mouth daily.    Marland Kitchen. zolpidem (AMBIEN) 10 MG tablet Take 10 mg by mouth at bedtime as needed. For sleep     No current facility-administered medications for this visit.    Allergies:   Augmentin; Fish oil; Flexeril; Lovaza; Minocycline; Percocet; and Sulfa antibiotics    Social History:  The patient  reports that she has quit smoking. She does not have any smokeless tobacco history on file. She reports that she does not drink alcohol.   Family History:  The patient's family history includes CAD  in her father and mother; Diabetes in her father; Glaucoma in her sister; Heart attack in her father; Hypertension in her sister; Hypothyroidism in her sister; Mental illness in her mother.    ROS:  Please see the history of present illness.   Otherwise, review of systems are positive for none.   All other systems are reviewed and negative.    PHYSICAL EXAM: VS:  BP 154/74 mmHg  Pulse 84  Ht 5\' 5"  (1.651 m)  Wt 201 lb 8 oz (91.4 kg)  BMI 33.53 kg/m2 , BMI Body mass index is 33.53 kg/(m^2). GEN: Well nourished, well developed, in no acute distress HEENT: normal Neck: no JVD, carotid bruits, or masses Cardiac: RRR; no murmurs, rubs, or gallops,no edema  Respiratory:  clear to auscultation bilaterally, normal work of breathing GI: soft, nontender, nondistended, + BS MS: no deformity or atrophy Skin: warm and dry, no rash Neuro:  Strength and sensation are intact Psych: euthymic mood, full affect   EKG:  EKG is ordered today. The ekg ordered today demonstrates sinus rhythm, rate 84, axis within normal limits, intervals within normal limits, anterior T-wave inversion.   Recent Labs: 11/22/2014: BUN 13; Creatinine 1.00; Hemoglobin 13.8; Platelets 267; Potassium 4.2; Sodium 141    Lipid Panel No results found for: CHOL, TRIG, HDL, CHOLHDL, VLDL, LDLCALC, LDLDIRECT    Wt Readings from Last 3 Encounters:  01/16/15 201 lb 8 oz (91.4 kg)      Other studies Reviewed: Additional studies/ records that were reviewed today include: cardiac cath 2000. Review of the above records demonstrates: 20% LAD and 20% IM stenosis   ASSESSMENT AND PLAN:  ABNORMAL EKG:  I doubt that this represents new obstructive coronary disease. However, stress testing is indicated. She would not be able to walk on a treadmill. Therefore, she will have a [employer] Myoview.   Current medicines are reviewed at length with the patient today.  The patient does not have concerns regarding medicines.  The  following changes have been made:  no change  Labs/ tests ordered today include: Lexiscan Myoview.  No orders of the defined types were placed in this encounter.     Disposition:   FU with me as needed   Signed, Rollene RotundaJames Azrael Huss, MD  01/16/2015 3:15 PM    [facility]

## 2015-01-16 NOTE — Patient Instructions (Signed)
Your physician recommends that you schedule a follow-up appointment in: as needed with Dr. Hochrein  We are ordering a stress test for you to get done    

## 2015-01-23 ENCOUNTER — Telehealth (HOSPITAL_COMMUNITY): Payer: Self-pay

## 2015-01-23 NOTE — Telephone Encounter (Signed)
Encounter complete. 

## 2015-01-28 ENCOUNTER — Ambulatory Visit (HOSPITAL_COMMUNITY)
Admission: RE | Admit: 2015-01-28 | Discharge: 2015-01-28 | Disposition: A | Discharge status: Home / Self Care | Payer: 59 | Source: Ambulatory Visit | Attending: Internal Medicine | Admitting: Internal Medicine

## 2015-01-28 DIAGNOSIS — R9431 Abnormal electrocardiogram [ECG] [EKG]: Secondary | ICD-10-CM

## 2015-02-07 ENCOUNTER — Telehealth (HOSPITAL_COMMUNITY): Payer: Self-pay

## 2015-02-07 NOTE — Telephone Encounter (Signed)
Encounter complete. 

## 2015-02-11 ENCOUNTER — Telehealth (HOSPITAL_COMMUNITY): Payer: Self-pay

## 2015-02-11 NOTE — Telephone Encounter (Signed)
Encounter complete. 

## 2015-02-12 ENCOUNTER — Ambulatory Visit (HOSPITAL_COMMUNITY)
Admission: RE | Admit: 2015-02-12 | Discharge: 2015-02-12 | Disposition: A | Discharge status: Home / Self Care | Payer: Medicare Other | Source: Ambulatory Visit | Attending: Internal Medicine | Admitting: Internal Medicine

## 2015-02-12 DIAGNOSIS — R0602 Shortness of breath: Secondary | ICD-10-CM | POA: Insufficient documentation

## 2015-02-12 DIAGNOSIS — R42 Dizziness and giddiness: Secondary | ICD-10-CM | POA: Insufficient documentation

## 2015-02-12 DIAGNOSIS — R9431 Abnormal electrocardiogram [ECG] [EKG]: Secondary | ICD-10-CM | POA: Insufficient documentation

## 2015-02-12 DIAGNOSIS — R0609 Other forms of dyspnea: Secondary | ICD-10-CM | POA: Insufficient documentation

## 2015-02-12 DIAGNOSIS — Z8249 Family history of ischemic heart disease and other diseases of the circulatory system: Secondary | ICD-10-CM | POA: Insufficient documentation

## 2015-02-12 DIAGNOSIS — E669 Obesity, unspecified: Secondary | ICD-10-CM | POA: Diagnosis not present

## 2015-02-12 DIAGNOSIS — E785 Hyperlipidemia, unspecified: Secondary | ICD-10-CM | POA: Insufficient documentation

## 2015-02-12 DIAGNOSIS — Z87891 Personal history of nicotine dependence: Secondary | ICD-10-CM | POA: Insufficient documentation

## 2015-02-12 MED ORDER — TECHNETIUM TC 99M SESTAMIBI GENERIC - CARDIOLITE
10.9000 | Freq: Once | INTRAVENOUS | Status: AC | PRN
  Administered 2015-02-12: 10.9 via INTRAVENOUS

## 2015-02-12 MED ORDER — TECHNETIUM TC 99M SESTAMIBI GENERIC - CARDIOLITE
32.2000 | Freq: Once | INTRAVENOUS | Status: AC | PRN
  Administered 2015-02-12: 32.2 via INTRAVENOUS

## 2015-02-12 MED ORDER — REGADENOSON 0.4 MG/5ML IV SOLN
0.4000 mg | Freq: Once | INTRAVENOUS | Status: AC
  Administered 2015-02-12: 0.4 mg via INTRAVENOUS

## 2015-02-12 NOTE — Procedures (Addendum)
[facility] [address] [place] [address] [phone]  Cardiology Nuclear Med Study  Patricia PateMarion H Sloan is a 69 y.o. female     MRN : [id number]     DOB: [birth year]  Procedure Date: 02/12/2015  Nuclear Med Background Indication for Stress Test:  Evaluation for Ischemia and Abnormal EKG History:  COPD and CKD;Minimal coronary plaque;No prior NUC MPI for comparison/ Cardiac Risk Factors: Family History - CAD, History of Smoking, Lipids and Obesity  Symptoms:  Dizziness, DOE, Fatigue, Light-Headedness and SOB   Nuclear Pre-Procedure Caffeine/Decaff Intake:  12:30am NPO After: 9:00am   IV Site: R Forearm  IV 0.9% NS with Angio Cath:  22g  Chest Size (in):  n/a IV Started by: Berdie OgrenAmanda Wease, RN  Height: 5\' 5"  (1.651 m)  Cup Size: D  BMI:  Body mass index is 33.45 kg/(m^2). Weight:  201 lb (91.173 kg)   Tech Comments:  n/a    Nuclear Med Study 1 or 2 day study: 1 day  Stress Test Type:  Lexiscan  Order Authorizing Provider:  Rollene RotundaJames, Hochrein, MD   Resting Radionuclide: Technetium 7267m Sestamibi  Resting Radionuclide Dose: 10.9 mCi   Stress Radionuclide:  Technetium 767m Sestamibi  Stress Radionuclide Dose: 32.2 mCi           Stress Protocol Rest HR: 75 Stress HR: 85  Rest BP: 156/76 Stress BP: 170/66  Exercise Time (min): n/a METS: n/a          Dose of Adenosine (mg):  n/a Dose of Lexiscan: 0.4 mg  Dose of Atropine (mg): n/a Dose of Dobutamine: n/a mcg/kg/min (at max HR)  Stress Test Technologist: [name], CCT Nuclear Technologist: [name], CNMT   Rest Procedure:  Myocardial perfusion imaging was performed at rest 45 minutes following the intravenous administration of Technetium 3767m Sestamibi. Stress Procedure:  The patient received IV Lexiscan 0.4 mg over 15-seconds.  Technetium 6967m Sestamibi injected IV at 30-seconds.  There were no significant changes with Lexiscan.  Quantitative spect images were  obtained after a 45 minute delay.  Transient Ischemic Dilatation (Normal <1.22):  1.07  QGS EDV:  62 ml QGS ESV:  16 ml LV Ejection Fraction: 74%  Rest ECG: NSR with non-specific ST-T wave changes  Stress ECG: No significant change from baseline ECG  QPS Raw Data Images:  Normal; no motion artifact; normal heart/lung ratio. Stress Images:  There is decreased uptake in the septum. Rest Images:  There is decreased uptake in the septum. Subtraction (SDS):  No reversibility is appreciated.  Impression Exercise Capacity:  Lexiscan with no exercise. BP Response:  Normal blood pressure response. Clinical Symptoms:  There is dyspnea. ECG Impression:  No significant ECG changes with Lexiscan. Comparison with Prior Nuclear Study: No previous nuclear study performed  Overall Impression:  Low risk stress nuclear study with small, mild fixed basal septal artifact, otherwise, no reversible ishcemia..  LV Wall Motion:  NL LV Function; NL Wall Motion; LVEF 74%  Chrystie NoseKenneth C. Maksim Peregoy, MD, [facility] HospitalFACC Board Certified in Nuclear Cardiology Attending Cardiologist [facility]  Chrystie NoseHILTY,Tangia Pinard C, MD  02/12/2015 7:24 PM

## 2016-08-26 ENCOUNTER — Institutional Professional Consult (permissible substitution): Payer: Self-pay | Admitting: Internal Medicine

## 2016-11-18 ENCOUNTER — Encounter: Payer: Self-pay | Admitting: Cardiology

## 2016-11-18 NOTE — Progress Notes (Signed)
Cardiology Office Note   Date:  11/21/2016   ID:  Patricia Sloan, DOB [birth year], MRN [id number]  PCP:  Hollice EspyGATES,DONNA RUTH, MD  Cardiologist:   Rollene RotundaJames Anahid Eskelson, MD   Chief Complaint  Patient presents with  . Pre-op Exam     History of Present Illness: Patricia PateMarion H Moomaw is a 70 y.o. female who presents for evaluation of an abnormal EKG. I saw her in March of last year and she had a negative Lexiscan Myoview.  She's going to have arthroscopic knee surgery.  She does have some chronic dyspnea is going to see Dr. Sherene SiresWert next week. She's had an abnormal EKG in the past. She had a catheterization years ago and had minimal coronary plaque. He chronically has had T-wave inversions in anterior and lateral leads. This is unchanged across recent EKGs. She is limited by some joint pain and fibromyalgia. However, she does her activities of daily living. She denies any ongoing chest discomfort, neck or arm discomfort. She's not had any new palpitations, presyncope or syncope. She denies any PND or orthopnea. She's had some chronic discomfort in her chest that has been diagnosed previously as fibromyalgia this is been a stable pattern unchanged over time.  Past Medical History:  Diagnosis Date  . Allergic rhinitis   . CKD (chronic kidney disease)    Secondary to Lodine.  Creat is normal now.   Marland Kitchen. COPD (chronic obstructive pulmonary disease) (HCC)   . Depression   . Fibromyalgia   . Herpes   . HLD (hyperlipidemia)   . Osteopenia   . Vitamin D deficiency     Past Surgical History:  Procedure Laterality Date  . CHOLECYSTECTOMY    . MOUTH SURGERY    . right foot surgery    . VAGINAL HYSTERECTOMY       Current Outpatient Prescriptions  Medication Sig Dispense Refill  . albuterol (PROVENTIL HFA;VENTOLIN HFA) 108 (90 BASE) MCG/ACT inhaler Inhale 2 puffs into the lungs every 6 (six) hours as needed for wheezing or shortness of breath.    . ALPRAZolam (XANAX) 0.5 MG tablet Take 0.5 mg by mouth at  bedtime as needed. anxiety    . diphenhydrAMINE (SOMINEX) 25 MG tablet Take 25 mg by mouth daily as needed. allergies    . ergocalciferol (VITAMIN D2) 50000 UNITS capsule Take 50,000 Units by mouth once a week. on Fridays    . fenofibrate (TRICOR) 145 MG tablet Take 145 mg by mouth daily.    . fluticasone (FLONASE) 50 MCG/ACT nasal spray Place 2 sprays into the nose daily.    . Fluticasone-Salmeterol (ADVAIR) 100-50 MCG/DOSE AEPB Inhale 1 puff into the lungs 2 (two) times daily.    Marland Kitchen. HYDROcodone-acetaminophen (VICODIN) 5-500 MG per tablet Take 1 tablet by mouth every 6 (six) hours as needed. For pain    . ketorolac (TORADOL) 10 MG tablet Take 1 tablet (10 mg total) by mouth every 6 (six) hours as needed for pain (do not exceed 40 mg/day ( no greater than 4 pills/day)). 40 tablet 0  . Multiple Vitamin (MULTIVITAMIN WITH MINERALS) TABS Take 1 tablet by mouth daily.    Marland Kitchen. omeprazole (PRILOSEC) 20 MG capsule Take 20 mg by mouth daily.    . pregabalin (LYRICA) 75 MG capsule Take 75 mg by mouth 2 (two) times daily.    . sertraline (ZOLOFT) 100 MG tablet Take 100 mg by mouth daily.    . simvastatin (ZOCOR) 40 MG tablet Take 40 mg by mouth daily.    .Marland Kitchen  Tamsulosin HCl (FLOMAX) 0.4 MG CAPS Take 1 capsule (0.4 mg total) by mouth daily. Discontinue after stone passes. 20 capsule 0  . vitamin B-12 (CYANOCOBALAMIN) 1000 MCG tablet Take 1,000 mcg by mouth daily.    Marland Kitchen. zolpidem (AMBIEN) 10 MG tablet Take 10 mg by mouth at bedtime as needed. For sleep     No current facility-administered medications for this visit.     Allergies:   Augmentin [amoxicillin-pot clavulanate]; Fish oil; Flexeril [cyclobenzaprine]; Lovaza [omega-3-acid ethyl esters]; Minocycline; Percocet [oxycodone-acetaminophen]; and Sulfa antibiotics    ROS:  Please see the history of present illness.   Otherwise, review of systems are positive for none.   All other systems are reviewed and negative.    PHYSICAL EXAM: BP (!) 146/78   Pulse 80    Ht 5\' 5"  (1.651 m)   Wt 195 lb (88.5 kg)   BMI 32.45 kg/m  GENERAL:  Well appearing NECK:  No jugular venous distention, waveform within normal limits, carotid upstroke brisk and symmetric, no bruits, no thyromegaly LUNGS:  Clear to auscultation bilaterally BACK:  No CVA tenderness HEART:  PMI not displaced or sustained,S1 and S2 within normal limits, no S3, no S4, no clicks, no rubs, no murmurs ABD:  Flat, positive bowel sounds normal in frequency in pitch, no bruits, no rebound, no guarding, no midline pulsatile mass, no hepatomegaly, no splenomegaly EXT:  2 plus pulses throughout, no edema, no cyanosis no clubbing   EKG:  EKG is  ordered today. The ekg ordered today demonstrates sinus rhythm, rate 80, axis within normal limits, intervals within normal limits, anterior T-wave inversion.   Recent Labs: No results found for requested labs within last 8760 hours.    Lipid Panel No results found for: CHOL, TRIG, HDL, CHOLHDL, VLDL, LDLCALC, LDLDIRECT    Wt Readings from Last 3 Encounters:  11/19/16 195 lb (88.5 kg)  02/12/15 201 lb (91.2 kg)  01/16/15 201 lb 8 oz (91.4 kg)      Other studies Reviewed: Additional studies/ records that were reviewed today include: Lexiscan Myoview. I Review of the above records demonstrates:    ASSESSMENT AND PLAN:  PREOP:  The patient has no high-risk symptoms or findings. She had a negative perfusion study last year. This is not high-risk procedure. Therefore, based on ACC/AHA guidelines, the patient would be at acceptable risk for the planned procedure without further cardiovascular testing.  ABNORMAL EKG:  As above.    Current medicines are reviewed at length with the patient today.  The patient does not have concerns regarding medicines.  The following changes have been made:  none  Labs/ tests ordered today include: None No orders of the defined types were placed in this encounter.    Disposition:   FU with me as needed.     Signed, Rollene RotundaJames Loida Calamia, MD  11/21/2016 9:17 PM    [facility]

## 2016-11-19 ENCOUNTER — Ambulatory Visit (INDEPENDENT_AMBULATORY_CARE_PROVIDER_SITE_OTHER): Payer: Medicare Other | Admitting: Cardiology

## 2016-11-19 ENCOUNTER — Encounter: Payer: Self-pay | Admitting: Cardiology

## 2016-11-19 VITALS — BP 146/78 | HR 80 | Ht 65.0 in | Wt 195.0 lb

## 2016-11-19 DIAGNOSIS — R9431 Abnormal electrocardiogram [ECG] [EKG]: Secondary | ICD-10-CM | POA: Diagnosis not present

## 2016-11-19 NOTE — Patient Instructions (Signed)
Your physician recommends that you schedule a follow-up appointment in: As Needed    

## 2016-11-21 ENCOUNTER — Encounter: Payer: Self-pay | Admitting: Cardiology

## 2016-11-26 ENCOUNTER — Encounter: Payer: Self-pay | Admitting: Internal Medicine

## 2016-11-26 ENCOUNTER — Other Ambulatory Visit (INDEPENDENT_AMBULATORY_CARE_PROVIDER_SITE_OTHER): Payer: Medicare Other

## 2016-11-26 ENCOUNTER — Ambulatory Visit (INDEPENDENT_AMBULATORY_CARE_PROVIDER_SITE_OTHER): Payer: Medicare Other | Admitting: Internal Medicine

## 2016-11-26 ENCOUNTER — Ambulatory Visit (INDEPENDENT_AMBULATORY_CARE_PROVIDER_SITE_OTHER)
Admission: RE | Admit: 2016-11-26 | Discharge: 2016-11-26 | Disposition: A | Discharge status: Home / Self Care | Payer: Medicare Other | Source: Ambulatory Visit | Attending: Internal Medicine | Admitting: Internal Medicine

## 2016-11-26 VITALS — BP 164/90 | HR 85 | Ht 65.0 in | Wt 197.8 lb

## 2016-11-26 DIAGNOSIS — R058 Other specified cough: Secondary | ICD-10-CM

## 2016-11-26 DIAGNOSIS — J449 Chronic obstructive pulmonary disease, unspecified: Secondary | ICD-10-CM

## 2016-11-26 DIAGNOSIS — R05 Cough: Secondary | ICD-10-CM

## 2016-11-26 LAB — CBC WITH DIFFERENTIAL/PLATELET
BASOS ABS: 0 10*3/uL (ref 0.0–0.1)
Basophils Relative: 0.5 % (ref 0.0–3.0)
EOS PCT: 1.6 % (ref 0.0–5.0)
Eosinophils Absolute: 0.1 10*3/uL (ref 0.0–0.7)
HCT: 43.7 % (ref 36.0–46.0)
Hemoglobin: 14.8 g/dL (ref 12.0–15.0)
Lymphocytes Relative: 30.7 % (ref 12.0–46.0)
Lymphs Abs: 2.5 10*3/uL (ref 0.7–4.0)
MCHC: 33.8 g/dL (ref 30.0–36.0)
MCV: 82.4 fl (ref 78.0–100.0)
MONO ABS: 0.5 10*3/uL (ref 0.1–1.0)
MONOS PCT: 6.7 % (ref 3.0–12.0)
NEUTROS ABS: 4.9 10*3/uL (ref 1.4–7.7)
NEUTROS PCT: 60.5 % (ref 43.0–77.0)
PLATELETS: 263 10*3/uL (ref 150.0–400.0)
RBC: 5.3 Mil/uL — AB (ref 3.87–5.11)
RDW: 14.1 % (ref 11.5–15.5)
WBC: 8 10*3/uL (ref 4.0–10.5)

## 2016-11-26 MED ORDER — FAMOTIDINE 20 MG PO TABS: ORAL_TABLET | ORAL | Status: AC

## 2016-11-26 MED ORDER — OMEPRAZOLE 40 MG PO CPDR: 40.0000 mg | DELAYED_RELEASE_CAPSULE | Freq: Every day | ORAL | 11 refills | Status: AC

## 2016-11-26 NOTE — Progress Notes (Signed)
Subjective:    Patient ID: Patricia Sloan, female    DOB: 1946-11-23,    MRN: [id number]  HPI  [age] yowf quit smoking 2013 with pattern of cough/ congestion/sinus infections x around 2000 with freq need for abx/pred multiple ov's and then some better for months to maybe a year before relapsing and then started on advair/ alb 2015 and still having same pattern/ similar symptoms  so referred to pulmonary clinic 11/26/2016 by Dr  Patricia Sloan    11/26/2016 1st Patricia Sloan/ Patricia Sloan   Chief Complaint  Patient presents with  . Pulmonary Consult    Dr. Kevan Sloan referred pt, has COPD, always SOB, caough for 6 months, uses Advair, nebulizer, and albuterol level, white mucus when coughing  sleeps poorly due to fibromyalgia and cough > sob maybre 50 % better p nebulizer despite maint rx with  advair dpi doubled dose x one month ? Worse cough > sob since increased bit mucus is minimal vol/ white Limited walking due to knee > sob  "just at the end" of a typical flare already received abx/ prednisone and still can't sleep thru the night s coughing On ppi qam maint and flonase as well      No obvious patterns in day to day or daytime variability or assoc excess/ purulent sputum or mucus plugs or hemoptysis or cp or chest tightness, subjective wheeze or overt   hb symptoms. No unusual exp hx or h/o childhood pna/ asthma or knowledge of premature birth.  Sleeping ok without nocturnal  or early am exacerbation  of respiratory  c/o's or need for noct saba. Also denies any obvious fluctuation of symptoms with weather or environmental changes or other aggravating or alleviating factors except as outlined above   Current Medications, Allergies, Complete Past Medical History, Past Surgical History, Family History, and Social History were [facility].      Review of Systems  HENT: Positive for congestion, nosebleeds, postnasal drip, rhinorrhea,  sneezing, trouble swallowing and voice change.   Respiratory: Positive for cough, choking and shortness of breath.   Cardiovascular: Positive for chest pain.  Gastrointestinal: Positive for vomiting.  Musculoskeletal: Positive for arthralgias.  Neurological: Negative for tremors, syncope and headaches.  Hematological: Does not bruise/bleed easily.       Objective:   Physical Exam  amb wf nad  Wt Readings from Last 3 Encounters:  11/26/16 197 lb 12.8 oz (89.7 kg)  11/19/16 195 lb (88.5 kg)  02/12/15 201 lb (91.2 kg)    Vital signs reviewed  - Note on arrival 02 sats  95% on RA     HEENT: nl dentition, turbinates, and oropharynx. Nl external ear canals without cough reflex   NECK :  without JVD/Nodes/TM/ nl carotid upstrokes bilaterally   LUNGS: no acc muscle use,  Nl contour chest with distant bs bilaterally s wheeze    CV:  RRR  no s3 or murmur or increase in P2, nad no edema   ABD:  soft and nontender with nl inspiratory excursion in the supine position. No bruits or organomegaly appreciated, bowel sounds nl  MS:  Nl gait/ ext warm without deformities, calf tenderness, cyanosis or clubbing No obvious joint restrictions   SKIN: warm and dry without lesions    NEURO:  alert, approp, nl sensorium with  no motor or cerebellar deficits apparent.    CXR PA and Lateral:   11/26/2016 :    I personally reviewed images and  agree with radiology impression as follows:    Mild chronic bronchitic changes, stable. No pneumonia, CHF, nor other acute cardiopulmonary abnormalities.  Labs ordered 11/26/2016  Allergy profile       Assessment & Plan:

## 2016-11-26 NOTE — Patient Instructions (Addendum)
Stop advair  Only use your albuterol as a rescue medication to be used if you can't catch your breath by resting or doing a relaxed purse lip breathing pattern.  - The less you use it, the better it will work when you need it. - Ok to use up to 2 puffs  every 4 hours if you must but call for immediate appointment if use goes up over your usual need - Don't leave home without it !!  (think of it like the spare tire for your car)   Double the prilosec to where you take 40 mg(omeprazole is the generic)   Take 30-60 min before first meal of the day and Pepcid ac 20 mg daily until return   GERD (REFLUX)  is an extremely common cause of respiratory symptoms just like yours , many times with no obvious heartburn at all.    It can be treated with medication, but also with lifestyle changes including elevation of the head of your bed (ideally with 6 inch  bed blocks),  Smoking cessation, avoidance of late meals, excessive alcohol, and avoid fatty foods, chocolate, peppermint, colas, red wine, and acidic juices such as orange juice.  NO MINT OR MENTHOL PRODUCTS SO NO COUGH DROPS   USE SUGARLESS CANDY INSTEAD (Jolley ranchers or Stover's or Life Savers) or even ice chips will also do - the key is to swallow to prevent all throat clearing. NO OIL BASED VITAMINS - use powdered substitutes.  Please see patient coordinator before you leave today  to schedule sinus CT   Please remember to go to the lab and x-ray department downstairs for your tests - we will call you with the results when they are available.     Please schedule a follow up office visit in 4 weeks, sooner if needed - bring all your medications with you on return

## 2016-11-27 NOTE — Assessment & Plan Note (Addendum)
Trial off advair .11/26/2016 >>> - Allergy profile 11/26/2016 >  Eos 0.1 /  IgE  pending - Sinus CT pending     Upper airway cough syndrome (previously labeled PNDS) , is  so named because it's frequently impossible to sort out how much is  CR/sinusitis with freq throat clearing (which can be related to primary GERD)   vs  causing  secondary (" extra esophageal")  GERD from wide swings in gastric pressure that occur with throat clearing, often  promoting self use of mint and menthol lozenges that reduce the lower esophageal sphincter tone and exacerbate the problem further in a cyclical fashion.   These are the same pts (now being labeled as having "irritable larynx syndrome" by some cough centers) who not infrequently have a history of having failed to tolerate ace inhibitors,  dry powder inhalers or biphosphonates or report having atypical/extraesophageal reflux symptoms that don't respond to standard doses of PPI  and are easily confused as having aecopd or asthma flares by even experienced allergists/ pulmonologists (myself included).    So needs to try on max gerd rx and off dpi/ eval for allergy/ sinus CT  and if flares off advair or can't get off the laba  rec trial of low dose ics /laba in hfa form:   ie dulera 100 or symb 80 2bid  Either of which should be better tolerated than advair, assuming she can master hfa technique.  Total time devoted to counseling  > 50 % of 60 m office visit:  review case with pt/ discussion of options/alternatives/ personally creating written customized instructions  in presence of pt  then going over those specific  Instructions directly with the pt including how to use all of the meds but in particular covering each new medication in detail and the difference between the maintenance/automatic meds and the prns using an action plan format for the latter.  Please see AVS from this visit for a full list of these instructions

## 2016-11-27 NOTE — Assessment & Plan Note (Addendum)
Spirometry 11/26/2016  FEV1 2.15 (90%)  Ratio 70 with only upper portion of f/v loop  truncated on avdair ? 250 bid   So she has no significant copd and yet is "saba dep" for mostly cough symptoms with upper airway features both clinically and supported by spirometry so rec try off advair and rx as uacs (see separate a/p)   She could certainly still have asthma and needs to keep saba on hand for prn use and monitor use vs baseline but hope is that she needs this less, not more over time and if not rechallenge with hfa laba/ics in low doses

## 2016-11-29 NOTE — Progress Notes (Signed)
Spoke with pt and notified of results per Dr. Wert. Pt verbalized understanding and denied any questions. 

## 2016-12-01 LAB — RESPIRATORY ALLERGY PROFILE REGION II ~~LOC~~
Allergen, C. Herbarum, M2: <0.1 kU/L
Allergen, Comm Silver Birch, t9: <0.1 kU/L
Allergen, Cottonwood, t14: <0.1 kU/L
Allergen, D pternoyssinus,d7: <0.1 kU/L
Allergen, Mouse Urine Protein, e78: <0.1 kU/L
Allergen, Mulberry, t76: <0.1 kU/L
Allergen, Oak,t7: <0.1 kU/L
Allergen, P. notatum, m1: <0.1 kU/L
Aspergillus fumigatus, m3: <0.1 kU/L
Bermuda Grass: <0.1 kU/L
Common Ragweed: <0.1 kU/L
Dog Dander: <0.1 kU/L
Elm IgE: <0.1 kU/L
IgE (Immunoglobulin E), Serum: 27 kU/L (ref <115)
Pecan/Hickory Tree IgE: <0.1 kU/L
Timothy Grass: <0.1 kU/L

## 2016-12-01 NOTE — Progress Notes (Signed)
Spoke with pt and notified of results per Dr. Wert. Pt verbalized understanding and denied any questions. 

## 2016-12-02 ENCOUNTER — Ambulatory Visit (INDEPENDENT_AMBULATORY_CARE_PROVIDER_SITE_OTHER)
Admission: RE | Admit: 2016-12-02 | Discharge: 2016-12-02 | Disposition: A | Discharge status: Home / Self Care | Payer: Medicare Other | Source: Ambulatory Visit | Attending: Internal Medicine | Admitting: Internal Medicine

## 2016-12-02 DIAGNOSIS — R058 Other specified cough: Secondary | ICD-10-CM

## 2016-12-02 DIAGNOSIS — R05 Cough: Secondary | ICD-10-CM

## 2016-12-02 NOTE — Progress Notes (Signed)
LMTCB

## 2016-12-03 NOTE — Progress Notes (Signed)
ATC, msg states call will not go through at this time [facility]

## 2016-12-27 ENCOUNTER — Ambulatory Visit (INDEPENDENT_AMBULATORY_CARE_PROVIDER_SITE_OTHER): Payer: Medicare Other | Admitting: Internal Medicine

## 2016-12-27 ENCOUNTER — Encounter: Payer: Self-pay | Admitting: Internal Medicine

## 2016-12-27 ENCOUNTER — Encounter (INDEPENDENT_AMBULATORY_CARE_PROVIDER_SITE_OTHER): Payer: Self-pay

## 2016-12-27 VITALS — BP 144/80 | HR 77 | Ht 65.0 in | Wt 196.6 lb

## 2016-12-27 DIAGNOSIS — J449 Chronic obstructive pulmonary disease, unspecified: Secondary | ICD-10-CM | POA: Diagnosis not present

## 2016-12-27 DIAGNOSIS — R058 Other specified cough: Secondary | ICD-10-CM

## 2016-12-27 DIAGNOSIS — R05 Cough: Secondary | ICD-10-CM

## 2016-12-27 LAB — NITRIC OXIDE: Nitric Oxide: 13

## 2016-12-27 NOTE — Patient Instructions (Addendum)
If any worse cough or breathing  >> Try omeprazole 40 mg Take 30-60 min before first meal of the day and add Pepcid 20 mg at bedtime and do this one week  before surgery   GERD (REFLUX)  is an extremely common cause of respiratory symptoms just like yours , many times with no obvious heartburn at all.    It can be treated with medication, but also with lifestyle changes including elevation of the head of your bed (ideally with 6 inch  bed blocks),  Smoking cessation, avoidance of late meals, excessive alcohol, and avoid fatty foods, chocolate, peppermint, colas, red wine, and acidic juices such as orange juice.  NO MINT OR MENTHOL PRODUCTS SO NO COUGH DROPS   USE SUGARLESS CANDY INSTEAD (Jolley ranchers or Stover's or Life Savers) or even ice chips will also do - the key is to swallow to prevent all throat clearing. NO OIL BASED VITAMINS - use powdered substitutes.   Please see patient coordinator before you leave today  to schedule overnight oximetry on room air     If not satisfied return to this office with all medications in hand and we will regroup.

## 2016-12-27 NOTE — Progress Notes (Signed)
Subjective:    Patient ID: Patricia Sloan, female    DOB: 09-12-1946,    MRN: [id number]    Brief patient profile:  35 yowf quit smoking 2013 with pattern of cough/ congestion/sinus infections x around 2000 with freq need for abx/pred multiple ov's and then some better for months to maybe a year before relapsing and then started on advair/ alb 2015 and still having same pattern/ similar symptoms  so referred to pulmonary clinic 11/26/2016 by Dr  Shaune Pollack    History of Present Illness  11/26/2016 1st [facility] office visit/ Patricia Sloan   Chief Complaint  Patient presents with  . Pulmonary Consult    Dr. Kevan Ny referred pt, has COPD, always SOB, caough for 6 months, uses Advair, nebulizer, and albuterol level, white mucus when coughing  sleeps poorly due to fibromyalgia and cough > sob maybre 50 % better p nebulizer despite maint rx with  advair dpi doubled dose x one month ? Worse cough > sob since increased bit mucus is minimal vol/ white Limited walking due to knee > sob  "just at the end" of a typical flare already received abx/ prednisone and still can't sleep thru the night s coughing On ppi qam maint and flonase as well    rec Stop advair Only use your albuterol as a rescue medication   Double the prilosec to where you take 40 mg(omeprazole is the generic)   Take 30-60 min before first meal of the day and Pepcid ac 20 mg daily until return  GERD (REFLUX)  Please see patient coordinator before you leave today  to schedule sinus CT Please remember to go to the lab and x-ray department downstairs for your tests - we will call you with the results when they are available.  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 Follow-up    4 week follow up COPD, has a little cough  today, she states she is doing better   no saba at all x > 24 hours and overall much better off advair but "I know it's allergies" anyway  Fatigue and aches/ not limited by doe at all / sleeping thru the noct now s any cough / did not follow instructions re gerd rx and just taking prilosec 20 mg with bfast at this point and no pepcid at all ("I've read all about these meds and I don't think they are safe")  No obvious day to day or daytime variability or assoc excess/ purulent sputum or mucus plugs or hemoptysis or cp or chest tightness, subjective wheeze or overt sinus or hb symptoms. No unusual exp hx or h/o childhood pna/ asthma or knowledge of premature birth.  Sleeping ok without nocturnal  or early am exacerbation  of respiratory  c/o's or need for noct saba. Also denies any obvious fluctuation of symptoms with weather or environmental changes or other aggravating or alleviating factors except as outlined above   Current Medications, Allergies, Complete Past Medical History, Past Surgical History, Family History, and Social History were reviewed in [employer] record.  ROS  The following are not active complaints unless bolded sore throat, dysphagia, dental problems, itching, sneezing,  nasal congestion or excess/ purulent secretions, ear ache,  fever, chills, sweats, unintended wt loss, classically pleuritic or exertional cp,  orthopnea pnd or leg swelling, presyncope, palpitations, abdominal pain, anorexia, nausea, vomiting, diarrhea  or change in bowel or bladder habits, change in stools or urine, dysuria,hematuria,  rash, arthralgias, visual complaints, headache, numbness, weakness or ataxia or problems with walking or coordination,  change in mood/affect or memory.               Objective:   Physical Exam  amb wf nad   12/27/2016      196   11/26/16 197 lb 12.8 oz (89.7 kg)  11/19/16 195 lb (88.5 kg)  02/12/15 201 lb (91.2 kg)    Vital signs reviewed  -  Note on arrival 02 sats  95% on RA     HEENT: nl dentition, turbinates, and oropharynx. Nl external ear canals without cough reflex   NECK :  without JVD/Nodes/TM/ nl carotid upstrokes bilaterally   LUNGS: no acc muscle use,  Nl contour chest with  distant bs bilaterally s wheeze    CV:  RRR  no s3 or murmur or increase in P2, nad no edema   ABD:  soft and nontender with nl inspiratory excursion in the supine position. No bruits or organomegaly appreciated, bowel sounds nl  MS:  Nl gait/ ext warm without deformities, calf tenderness, cyanosis or clubbing No obvious joint restrictions   SKIN: warm and dry without lesions    NEURO:  alert, approp, nl sensorium with  no motor or cerebellar deficits apparent.    CXR PA and Lateral:   11/26/2016 :    I personally reviewed images and agree with radiology impression as follows:    Mild chronic bronchitic changes, stable. No pneumonia, CHF, nor other acute cardiopulmonary abnormalities.         Assessment & Plan:   Outpatient Encounter Prescriptions as of 12/27/2016  Medication Sig  . albuterol (PROVENTIL HFA;VENTOLIN HFA) 108 (90 BASE) MCG/ACT inhaler Inhale 2 puffs into the lungs every 6 (six) hours as needed for wheezing or shortness of breath.  . ALPRAZolam (XANAX) 0.5 MG tablet Take 0.5 mg by mouth at bedtime as needed. anxiety  . diphenhydrAMINE (SOMINEX) 25 MG tablet Take 25 mg by mouth daily as needed. allergies  . ergocalciferol (VITAMIN D2) 50000 UNITS capsule Take 50,000 Units by mouth once a week. on Fridays  . ezetimibe-simvastatin (VYTORIN) 10-40 MG tablet Take 1 tablet by mouth daily.  . famotidine (PEPCID) 20 MG tablet One at bedtime  . fenofibrate (TRICOR) 145 MG tablet Take 145 mg by mouth daily.  . fluticasone (FLONASE) 50 MCG/ACT nasal spray Place 2 sprays into the nose daily.  Marland Kitchen. HYDROcodone-acetaminophen (VICODIN) 5-500 MG per tablet Take 1 tablet by mouth every 6 (six) hours as needed. For pain  .  hyoscyamine (LEVSIN) 0.125 MG/5ML ELIX Take 0.125 mg by mouth.  Marland Kitchen. ketorolac (TORADOL) 10 MG tablet Take 1 tablet (10 mg total) by mouth every 6 (six) hours as needed for pain (do not exceed 40 mg/day ( no greater than 4 pills/day)).  . Multiple Vitamin (MULTIVITAMIN WITH MINERALS) TABS Take 1 tablet by mouth daily.  Marland Kitchen. omeprazole (PRILOSEC) 40 MG capsule Take 1 capsule (40 mg total) by mouth daily.  . pregabalin (LYRICA) 75 MG capsule Take 75 mg by mouth 2 (two) times daily.  . sertraline (ZOLOFT) 100 MG tablet Take 100 mg by mouth daily.  . simvastatin (ZOCOR) 40 MG tablet Take 40 mg by mouth daily.  . Tamsulosin HCl (  FLOMAX) 0.4 MG CAPS Take 1 capsule (0.4 mg total) by mouth daily. Discontinue after stone passes.  . traMADol (ULTRAM) 50 MG tablet Take 50 mg by mouth every 6 (six) hours as needed.  . vitamin B-12 (CYANOCOBALAMIN) 1000 MCG tablet Take 1,000 mcg by mouth daily.  Marland Kitchen zolpidem (AMBIEN) 10 MG tablet Take 10 mg by mouth at bedtime as needed. For sleep   No facility-administered encounter medications on file as of 12/27/2016.

## 2016-12-28 NOTE — Assessment & Plan Note (Signed)
Trial off advair .11/26/2016 > improved 12/27/2016  - Allergy profile 11/26/2016 >  Eos 0.1 /  IgE  27 neg RAST  - Sinus CT 12/02/2016 > neg  - FENO 12/27/2016  =   13  Clearly better off advair with no evidence at all to suggest active allergy/asthma.  She did not follow the instructions re max gerd rx because she felt so much better off advair but the latter does not explain why she needed advair in the first place and I'm concerned she will flare again so the first step would be to max gerd rx   Of the three most common causes of chronic cough, only one (GERD)  can actually cause the other two (asthma and post nasal drip syndrome)  and perpetuate the cylce of cough inducing airway trauma, inflammation, heightened sensitivity to reflux which is prompted by the cough itself via a cyclical mechanism.    This may partially respond to steroids and look like asthma and post nasal drainage but never erradicated completely unless the cough and the secondary reflux are eliminated, preferably both at the same time.  While not intuitively obvious, many patients with chronic low grade reflux do not cough until there is a secondary insult that disturbs the protective epithelial barrier and exposes sensitive nerve endings.  This can be viral(as may have happened here)  or direct physical injury such as with an endotracheal tube(which will happen soon as she is having R knee surgery soon).   The point is that once this occurs, it is difficult to eliminate using anything but a maximally effective acid suppression regimen at least in the short run, accompanied by an appropriate diet to address non acid GERD.   rec max rx for gerd if flares or one week prior to known insult to the upper airway such as gen anesthesia.   I had an extended discussion with the patient reviewing all relevant studies completed to date and  lasting 15 to 20 minutes of a 25 minute visit    Each maintenance medication was reviewed in  detail including most importantly the difference between maintenance and prns and under what circumstances the prns are to be triggered using an action plan format that is not reflected in the computer generated alphabetically organized AVS.    Please see AVS for specific instructions unique to this visit that I personally wrote and verbalized to the the pt in detail and then reviewed with pt  by my nurse highlighting any  changes in therapy recommended at today's visit to their plan of care.

## 2016-12-28 NOTE — Assessment & Plan Note (Signed)
Spirometry 11/26/2016  FEV1 2.15 (90%)  Ratio 70 with only upper portion of f/v loop  truncated  on avdair ? 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  - Spirometry 12/27/2016  FEV1 2.26  (77%)  Ratio 77 off all rx with nl f/v contour   So clearly her symptoms are not copd and not likely to be asthma either    I reviewed the Fletcher curve with the patient that basically indicates  if you quit smoking when your best day FEV1 is still well preserved (as is clearly  the case here)  it is highly unlikely you will progress to severe disease and informed the patient there was  no medication on the market that has proven to alter the curve/ its downward trajectory  or the likelihood of progression of their disease(unlike other chronic medical conditions such as atheroclerosis where we do think we can change the natural hx with risk reducing meds)    Therefore stopping smoking and maintaining abstinence is the most important aspect of hercare, not choice of inhalers or for that matter, doctors.    Her prognosis is excellent/ follow up is prn

## 2017-01-03 NOTE — Addendum Note (Signed)
Addended by: Alyson InglesBROOME, MICHELLE L on: 01/03/2017 09:30 AM   Modules accepted: Orders

## 2017-10-14 ENCOUNTER — Other Ambulatory Visit: Payer: Self-pay | Admitting: Internal Medicine

## 2018-06-12 ENCOUNTER — Other Ambulatory Visit: Payer: Self-pay | Admitting: Acute Care

## 2018-06-12 DIAGNOSIS — Z87891 Personal history of nicotine dependence: Secondary | ICD-10-CM

## 2018-06-12 DIAGNOSIS — Z122 Encounter for screening for malignant neoplasm of respiratory organs: Secondary | ICD-10-CM

## 2018-06-12 NOTE — Progress Notes (Signed)
Chest ct

## 2018-06-23 ENCOUNTER — Ambulatory Visit (INDEPENDENT_AMBULATORY_CARE_PROVIDER_SITE_OTHER): Payer: Medicare Other | Admitting: Acute Care

## 2018-06-23 ENCOUNTER — Encounter: Payer: Self-pay | Admitting: Acute Care

## 2018-06-23 ENCOUNTER — Ambulatory Visit (INDEPENDENT_AMBULATORY_CARE_PROVIDER_SITE_OTHER)
Admission: RE | Admit: 2018-06-23 | Discharge: 2018-06-23 | Disposition: A | Discharge status: Home / Self Care | Payer: Medicare Other | Source: Ambulatory Visit | Attending: Acute Care | Admitting: Acute Care

## 2018-06-23 DIAGNOSIS — Z122 Encounter for screening for malignant neoplasm of respiratory organs: Secondary | ICD-10-CM

## 2018-06-23 DIAGNOSIS — Z87891 Personal history of nicotine dependence: Secondary | ICD-10-CM

## 2018-06-23 NOTE — Progress Notes (Signed)
Shared Decision Making Visit Lung Cancer Screening Program [phone])   Eligibility:  Age 73 y.o.  Pack Years Smoking History Calculation 43 pack year smoking history (# packs/per year x # years smoked)  Recent History of coughing up blood  no  Unexplained weight loss? no ( >Than 15 pounds within the last 6 months )  Prior History Lung / other cancer no (Diagnosis within the last 5 years already requiring surveillance chest CT Scans).  Smoking Status Former Smoker  Former Smokers: Years since quit: 6 years  Quit Date: 05/2012  Visit Components:  Discussion included one or more decision making aids.yes  Discussion included risk/benefits of screening. yes  Discussion included potential follow up diagnostic testing for abnormal scans. yes  Discussion included meaning and risk of over diagnosis. yes  Discussion included meaning and risk of False Positives. yes  Discussion included meaning of total radiation exposure. yes  Counseling Included:  Importance of adherence to annual lung cancer LDCT screening. yes  Impact of comorbidities on ability to participate in the program. yes  Ability and willingness to under diagnostic treatment. yes  Smoking Cessation Counseling:  Current Smokers:   Discussed importance of smoking cessation. yes  Information about tobacco cessation classes and interventions provided to patient. yes  Patient provided with "ticket" for LDCT Scan. yes  Symptomatic Patient. no  Counseling  Diagnosis Code: Tobacco Use Z72.0  Asymptomatic Patient yes  Counseling (Intermediate counseling: > three minutes counseling) U0454  Former Smokers:   Discussed the importance of maintaining cigarette abstinence. yes  Diagnosis Code: Personal History of Nicotine Dependence. U98.119  Information about tobacco cessation classes and interventions provided to patient. Yes  Patient provided with "ticket" for LDCT Scan. yes  Written Order for Lung Cancer  Screening with LDCT placed in Epic. Yes (CT Chest Lung Cancer Screening Low Dose W/O CM) [id number] Z12.2-Screening of respiratory organs Z87.891-Personal history of nicotine dependence   I spent 25 minutes of face to face time with Patricia Sloan discussing the risks and benefits of lung cancer screening. We viewed a power point together that explained in detail the above noted topics. We took the time to pause the power point at intervals to allow for questions to be asked and answered to ensure understanding. We discussed that she had taken the single most powerful action possible to decrease her risk of developing lung cancer when she quit smoking. I counseled her to remain smoke free, and to contact me if she ever had the desire to smoke again so that I can provide resources and tools to help support the effort to remain smoke free. We discussed the time and location of the scan, and that either  Patricia Miyamoto RN or I will call with the results within  24-48 hours of receiving them. She has my card and contact information in the event she needs to speak with me, in addition to a copy of the power point we reviewed as a resource. She verbalized understanding of all of the above and had no further questions upon leaving the office.     I explained to the patient that there has been a high incidence of coronary artery disease noted on these exams. I explained that this is a non-gated exam therefore degree or severity cannot be determined. This patient is currently on statin therapy. I have asked the patient to follow-up with their PCP regarding any incidental finding of coronary artery disease and management with diet or medication as they feel is clinically  indicated. The patient verbalized understanding of the above and had no further questions.    Bevelyn NgoSarah F Gera Inboden, NP 06/23/2018 3:57 PM

## 2018-06-29 ENCOUNTER — Other Ambulatory Visit: Payer: Self-pay | Admitting: Acute Care

## 2018-06-29 DIAGNOSIS — Z122 Encounter for screening for malignant neoplasm of respiratory organs: Secondary | ICD-10-CM

## 2018-06-29 DIAGNOSIS — Z87891 Personal history of nicotine dependence: Secondary | ICD-10-CM

## 2019-08-21 ENCOUNTER — Ambulatory Visit: Payer: Medicare Other

## 2019-09-11 ENCOUNTER — Telehealth: Payer: Self-pay | Admitting: Internal Medicine

## 2019-09-11 NOTE — Telephone Encounter (Signed)
Called and spoke with Patient. Patient stated she would like a telephone visit with NP. Patient stated she would like to discuss possible ONO, oxygen, fatigue. Patient is scared to leave her home at this time. Patient scheduled with Eustaquio Maize, NP, 3pm 09/12/19. Nothing further at this time.

## 2019-09-11 NOTE — Telephone Encounter (Signed)
I called this patient trying to get her LCS CT scheduled and she is still not ready to venture out in public just yet. She did state she would like to do a virtual office visit to talk about her sometimes SOB. She stated that is could be with a NP. Can someone help to get this setup for her. Phone # [phone]

## 2019-09-12 ENCOUNTER — Ambulatory Visit (INDEPENDENT_AMBULATORY_CARE_PROVIDER_SITE_OTHER): Payer: Medicare Other | Admitting: Primary Care

## 2019-09-12 ENCOUNTER — Other Ambulatory Visit: Payer: Self-pay

## 2019-09-12 ENCOUNTER — Encounter: Payer: Self-pay | Admitting: Primary Care

## 2019-09-12 DIAGNOSIS — R0600 Dyspnea, unspecified: Secondary | ICD-10-CM | POA: Diagnosis not present

## 2019-09-12 DIAGNOSIS — R05 Cough: Secondary | ICD-10-CM

## 2019-09-12 DIAGNOSIS — R058 Other specified cough: Secondary | ICD-10-CM

## 2019-09-12 NOTE — Patient Instructions (Addendum)
Orders: Overnight oximetry re: COPD   Follow-up After testing results

## 2019-09-12 NOTE — Progress Notes (Signed)
Virtual Visit via Telephone Note  I connected with Patricia Sloan on 09/12/19 at  3:00 PM EDT by telephone and verified that I am speaking with the correct person using two identifiers.  Location: Patient: Home Provider: Office   I discussed the limitations, risks, security and privacy concerns of performing an evaluation and management service by telephone and the availability of in person appointments. I also discussed with the patient that there may be a patient responsible charge related to this service. The patient expressed understanding and agreed to proceed.   History of Present Illness: 73 year old female, former smoker quit 2013(43 pack year hx). Past medical history significant for COPD Gold 0, upper airway cough syndrome, fibromyalgia. Patient of Dr. Melvyn Novas, last seen by pulmonary nurse practitioner on 06/23/18 for lung cancer screening. She has not been seen by Dr. Melvyn Novas since January 2018. Maintained on Albuterol hfa, flonase and pepcid.   09/12/2019 Patient contacted today for a follow-up via telephone visit.  Patient reports experiencing shortness of breath and cough at night. States that she cannot sleep through the night due to her breathing symptoms. She feels she may need oxygen. She uses her Albuterol rescue inhaler 1-2 times a night. Denies apneic periods.   Observations/Objective:  -No significant shortness of breath, cough or wheezing noted during phone conversation  Office testing: Spirometry 11/26/2016  FEV1 2.15 (90%)  Ratio 70 with only upper portion of f/v loop  truncated  on avdair ? 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   Spirometry 12/27/2016  FEV1 2.26  (77%)  Ratio 77 off all rx with nl f/v contour   Trial off advair .11/26/2016 > improved 12/27/2016  - Allergy profile 11/26/2016 >  Eos 0.1 /  IgE  27 neg RAST  - Sinus CT 12/02/2016 > neg  - FENO 12/27/2016  =   13  Assessment and Plan:  Nocturnal dyspnea: -Check ONO on room air to  determine oxygen need   Upper airway cough syndrome: - Better off Advair, no evidence to suggest active allergy/asthma - Continue Omeprazole 40mg  30 min before first meal and Pepcid 20mg  at bedtime   Follow Up Instructions:   -We will follow-up after results  I discussed the assessment and treatment plan with the patient. The patient was provided an opportunity to ask questions and all were answered. The patient agreed with the plan and demonstrated an understanding of the instructions.   The patient was advised to call back or seek an in-person evaluation if the symptoms worsen or if the condition fails to improve as anticipated.  I provided 15 minutes of non-face-to-face time during this encounter.   Martyn Ehrich, NP

## 2019-09-13 ENCOUNTER — Telehealth: Payer: Self-pay | Admitting: Primary Care

## 2019-09-13 NOTE — Telephone Encounter (Signed)
According to the document in beth's note it is likely on RA as patient is not on oxygen.   Beth please advise, would you like ONO on RA? Thanks.

## 2019-09-14 ENCOUNTER — Encounter: Payer: Self-pay | Admitting: Primary Care

## 2019-09-14 NOTE — Telephone Encounter (Signed)
Room air please.

## 2019-09-14 NOTE — Telephone Encounter (Signed)
Order has been updated. Will route to [facility] pool so they can get the updated order to angela.   Nothing further needed at this time.

## 2019-09-14 NOTE — Progress Notes (Signed)
Chart and office note reviewed in detail  > agree with a/p as outlined    

## 2019-09-14 NOTE — Telephone Encounter (Signed)
Patricia Sloan has sent this order over

## 2019-09-24 ENCOUNTER — Encounter: Payer: Self-pay | Admitting: Primary Care

## 2019-09-27 ENCOUNTER — Telehealth: Payer: Self-pay | Admitting: Primary Care

## 2019-09-27 DIAGNOSIS — G4734 Idiopathic sleep related nonobstructive alveolar hypoventilation: Secondary | ICD-10-CM | POA: Insufficient documentation

## 2019-09-27 NOTE — Telephone Encounter (Signed)
ONO on 09/24/19 showed O2 low 77%, baseline 88%. Spent 4 hours 14 mins <88% on room air. Please place new order for nocturnal oxygen. Needs to wear 2L at night.

## 2019-09-28 NOTE — Telephone Encounter (Signed)
Called spoke with patient and discussed ONO results and recommendations as stated by [facility] NP.  Patient okay with beginning O2 at bedtime.  Patient would also like to be tested for daytime O2.  Explained to patient need for F2F visit for qualifying walk.  Appt scheduled with Beth NP for Tuesday October 02, 2019 at 3pm.  Order for O2 placed Nothing further needed at this time; will sign off.

## 2019-10-02 ENCOUNTER — Ambulatory Visit (INDEPENDENT_AMBULATORY_CARE_PROVIDER_SITE_OTHER): Payer: Medicare Other

## 2019-10-02 ENCOUNTER — Other Ambulatory Visit: Payer: Self-pay

## 2019-10-02 ENCOUNTER — Encounter: Payer: Self-pay | Admitting: Primary Care

## 2019-10-02 ENCOUNTER — Ambulatory Visit: Payer: Medicare Other | Admitting: Primary Care

## 2019-10-02 VITALS — BP 114/92 | HR 84 | Temp 98.0°F | Ht 65.0 in | Wt 179.4 lb

## 2019-10-02 DIAGNOSIS — J449 Chronic obstructive pulmonary disease, unspecified: Secondary | ICD-10-CM

## 2019-10-02 DIAGNOSIS — R0602 Shortness of breath: Secondary | ICD-10-CM

## 2019-10-02 DIAGNOSIS — G4734 Idiopathic sleep related nonobstructive alveolar hypoventilation: Secondary | ICD-10-CM | POA: Diagnosis not present

## 2019-10-02 NOTE — Addendum Note (Signed)
Addended by: Suzzanne Cloud E on: 10/02/2019 04:36 PM   Modules accepted: Orders

## 2019-10-02 NOTE — Patient Instructions (Addendum)
Ambulatory oxygen testing showed that you do not require oxygen during day time  Recommendations: - Wear 2L oxygen at night - Continue Albuterol two puffs every 4-6 hours for shortness of breath/wheezing   Orders: - CXR and labs today (ordered) - Pulmonary function testing  - Echocardiogram re: dyspnea   Follow-up: - 6 weeks with Dr. Melvyn Novas after PFTs

## 2019-10-02 NOTE — Progress Notes (Signed)
CXR looked normal ; mild persistent bronchial thickening stable from prior

## 2019-10-02 NOTE — Assessment & Plan Note (Addendum)
-   Continues to have moderate dyspnea with ADLs - Lungs CTA, ambulatory O2 wnl  - Plan check CXR, basic labs and echocardiogram - Needs repeat PFTs - Use Albuterol hfa two puffs every 4-6 hours prn sob

## 2019-10-02 NOTE — Assessment & Plan Note (Signed)
-   Continue 2L nocturnal oxygen - Checking echocardiogram to r/o pulmonary hypertension

## 2019-10-02 NOTE — Progress Notes (Signed)
@Patient  ID: , female    DOB: [birth year], 73 y.o.   MRN: [id number]  Chief Complaint  Patient presents with  . Nocturnal dyspnea    Breathing issue continues even during the day. Still has issues with chronic fatigue and feels that everything is an exertion. Also using Lyrica due to fibromyalgia.    Referring provider: No ref. provider found  HPI: 73 year old female, former smoker quit 2013(43 pack year hx). Past medical history significant for COPD Gold 0, upper airway cough syndrome, chronic fatigue syndrome, fibromyalgia. Patient of Dr. 08-21-1994, last seen by pulmonary nurse practitioner on 06/23/18 for lung cancer screening. She has not been seen by Dr. 08/24/18 since January 2018. Maintained on Albuterol hfa, flonase and pepcid.   Previous [facility] pulmonary encounter: 09/12/2019 Patient contacted today for a follow-up via telephone visit.  Patient reports experiencing shortness of breath and cough at night. States that she cannot sleep through the night due to her breathing symptoms. She feels she may need oxygen. She uses her Albuterol rescue inhaler 1-2 times a night. Denies apneic periods.   10/02/2019 Patient presents today to qualify for oxygen. ONO on 09/24/19 showed oxygen desaturation to low 77% with baseline 88% on room air. She received oxygen, used it for the first time last night. Wearing 2L at night. States that she can not function d/t her dyspnea and chronic fatigue. She gets short of breath with ADLS and showering. States that her dyspnea is so bad she has to use power chair at store to shop. Uses albuterol rescue inhaler once daily with improvement. Reports improvement in cough after stopping Advair. Feels her symptoms are mainly related to chronic fatigue syndrome. She is not interested in any form of rehab.  Testing reviewed: 10/02/2019 Ambulatory walk test: Completed 3 laps at average pace, oxygen level 97% room air. Stopped half way d/t leg fatigue.   Spirometry  12/27/2016  FEV1 2.26  (77%)  Ratio 77 off all rx with nl f/v contour   Spirometry 11/26/2016  FEV1 2.15 (90%)  Ratio 70  Allergies  Allergen Reactions  . Augmentin [Amoxicillin-Pot Clavulanate] Nausea And Vomiting  . Fish Oil Diarrhea  . Flexeril [Cyclobenzaprine] Other (See Comments)    nightmaresa  . Lovaza [Omega-3-Acid Ethyl Esters] Diarrhea  . Minocycline Nausea And Vomiting  . Percocet [Oxycodone-Acetaminophen] Nausea And Vomiting  . Sulfa Antibiotics Nausea And Vomiting     There is no immunization history on file for this patient.  Past Medical History:  Diagnosis Date  . Allergic rhinitis   . CKD (chronic kidney disease)    Secondary to [place].  Creat is normal now.   11/28/2016 COPD (chronic obstructive pulmonary disease) (HCC)   . Depression   . Fibromyalgia   . Herpes   . HLD (hyperlipidemia)   . Osteopenia   . Vitamin D deficiency     Tobacco History: Social History   Tobacco Use  Smoking Status Former Smoker  . Packs/day: 1.00  . Years: 45.00  . Pack years: 45.00  . Types: Cigarettes  . Quit date: 05/13/2012  . Years since quitting: 7.3  Smokeless Tobacco Never Used   Counseling given: Not Answered   Outpatient Medications Prior to Visit  Medication Sig Dispense Refill  . albuterol (PROVENTIL HFA;VENTOLIN HFA) 108 (90 BASE) MCG/ACT inhaler Inhale 2 puffs into the lungs every 6 (six) hours as needed for wheezing or shortness of breath.    . ALPRAZolam (XANAX) 0.5 MG tablet Take 0.5 mg by mouth  at bedtime as needed. anxiety    . diphenhydrAMINE (SOMINEX) 25 MG tablet Take 25 mg by mouth daily as needed. allergies    . ergocalciferol (VITAMIN D2) 50000 UNITS capsule Take 50,000 Units by mouth once a week. on Fridays    . ezetimibe-simvastatin (VYTORIN) 10-40 MG tablet Take 1 tablet by mouth daily.    . famotidine (PEPCID) 20 MG tablet One at bedtime    . fenofibrate (TRICOR) 145 MG tablet Take 145 mg by mouth daily.    . fluticasone (FLONASE) 50 MCG/ACT nasal  spray Place 2 sprays into the nose daily.    Marland Kitchen. HYDROcodone-acetaminophen (VICODIN) 5-500 MG per tablet Take 1 tablet by mouth every 6 (six) hours as needed. For pain    . hyoscyamine (LEVSIN) 0.125 MG/5ML ELIX Take 0.125 mg by mouth.    Marland Kitchen. ketorolac (TORADOL) 10 MG tablet Take 1 tablet (10 mg total) by mouth every 6 (six) hours as needed for pain (do not exceed 40 mg/day ( no greater than 4 pills/day)). 40 tablet 0  . Multiple Vitamin (MULTIVITAMIN WITH MINERALS) TABS Take 1 tablet by mouth daily.    Marland Kitchen. omeprazole (PRILOSEC) 40 MG capsule Take 1 capsule (40 mg total) by mouth daily. 30 capsule 11  . pregabalin (LYRICA) 75 MG capsule Take 75 mg by mouth 2 (two) times daily.    . sertraline (ZOLOFT) 100 MG tablet Take 100 mg by mouth daily.    . simvastatin (ZOCOR) 40 MG tablet Take 40 mg by mouth daily.    . Tamsulosin HCl (FLOMAX) 0.4 MG CAPS Take 1 capsule (0.4 mg total) by mouth daily. Discontinue after stone passes. 20 capsule 0  . traMADol (ULTRAM) 50 MG tablet Take 50 mg by mouth every 6 (six) hours as needed.    . vitamin B-12 (CYANOCOBALAMIN) 1000 MCG tablet Take 1,000 mcg by mouth daily.    Marland Kitchen. zolpidem (AMBIEN) 10 MG tablet Take 10 mg by mouth at bedtime as needed. For sleep     No facility-administered medications prior to visit.    Review of Systems  Review of Systems  Constitutional: Positive for fatigue.  Respiratory: Positive for shortness of breath. Negative for cough and wheezing.   Cardiovascular: Negative.     Physical Exam  BP (!) 114/92 (BP Location: Left Arm, Patient Position: Sitting, Cuff Size: Normal)   Pulse 84   Temp 98 F (36.7 C)   Ht 5\' 5"  (1.651 m)   Wt 179 lb 6.4 oz (81.4 kg)   SpO2 95% Comment: on room air  BMI 29.85 kg/m  Physical Exam Constitutional:      General: She is not in acute distress.    Appearance: Normal appearance. She is not ill-appearing.  Cardiovascular:     Rate and Rhythm: Normal rate and regular rhythm.  Pulmonary:     Effort:  Pulmonary effort is normal.     Breath sounds: Normal breath sounds. No wheezing or rhonchi.  Skin:    General: Skin is warm and dry.  Neurological:     Mental Status: She is alert.  Psychiatric:        Mood and Affect: Mood normal.        Behavior: Behavior normal.        Thought Content: Thought content normal.        Judgment: Judgment normal.     Lab Results:  CBC    Component Value Date/Time   WBC 8.0 11/26/2016 1527   RBC 5.30 (H) 11/26/2016 1527  HGB 14.8 11/26/2016 1527   HCT 43.7 11/26/2016 1527   PLT 263.0 11/26/2016 1527   MCV 82.4 11/26/2016 1527   MCH 28.5 11/22/2014 1739   MCHC 33.8 11/26/2016 1527   RDW 14.1 11/26/2016 1527   LYMPHSABS 2.5 11/26/2016 1527   MONOABS 0.5 11/26/2016 1527   EOSABS 0.1 11/26/2016 1527   BASOSABS 0.0 11/26/2016 1527    BMET    Component Value Date/Time   NA 141 11/22/2014 1739   K 4.2 11/22/2014 1739   CL 109 11/22/2014 1739   CO2 17 (L) 11/22/2014 1739   GLUCOSE 110 (H) 11/22/2014 1739   BUN 13 11/22/2014 1739   CREATININE 1.00 11/22/2014 1739   CALCIUM 8.7 11/22/2014 1739   GFRNONAA 57 (L) 11/22/2014 1739   GFRAA 66 (L) 11/22/2014 1739    BNP No results found for: BNP  ProBNP No results found for: PROBNP  Imaging: No results found.   Assessment & Plan:   COPD GOLD 0  - Continues to have moderate dyspnea with ADLs - Lungs CTA, ambulatory O2 wnl  - Plan check CXR, basic labs and echocardiogram - Needs repeat PFTs - Use Albuterol hfa two puffs every 4-6 hours prn sob  Nocturnal hypoxemia - Continue 2L nocturnal oxygen - Checking echocardiogram to r/o pulmonary hypertension    Martyn Ehrich, NP 10/02/2019

## 2019-10-03 LAB — CBC WITH DIFFERENTIAL/PLATELET
Basophils Absolute: 0 10*3/uL (ref 0.0–0.1)
Basophils Relative: 0.4 % (ref 0.0–3.0)
Eosinophils Absolute: 0.2 10*3/uL (ref 0.0–0.7)
Eosinophils Relative: 1.6 % (ref 0.0–5.0)
HCT: 43.9 % (ref 36.0–46.0)
Hemoglobin: 14.5 g/dL (ref 12.0–15.0)
Lymphocytes Relative: 29.9 % (ref 12.0–46.0)
Lymphs Abs: 2.8 10*3/uL (ref 0.7–4.0)
MCHC: 32.9 g/dL (ref 30.0–36.0)
MCV: 84.5 fl (ref 78.0–100.0)
Monocytes Absolute: 0.5 10*3/uL (ref 0.1–1.0)
Monocytes Relative: 5.4 % (ref 3.0–12.0)
Neutro Abs: 5.9 10*3/uL (ref 1.4–7.7)
Neutrophils Relative %: 62.7 % (ref 43.0–77.0)
Platelets: 203 10*3/uL (ref 150.0–400.0)
RBC: 5.2 Mil/uL — ABNORMAL HIGH (ref 3.87–5.11)
RDW: 14.7 % (ref 11.5–15.5)
WBC: 9.5 10*3/uL (ref 4.0–10.5)

## 2019-10-03 LAB — BASIC METABOLIC PANEL
BUN: 7 mg/dL (ref 6–23)
CO2: 26 mEq/L (ref 19–32)
Calcium: 9.3 mg/dL (ref 8.4–10.5)
Chloride: 106 mEq/L (ref 96–112)
Creatinine, Ser: 0.71 mg/dL (ref 0.40–1.20)
GFR: 80.95 mL/min (ref >60.00)
Glucose, Bld: 92 mg/dL (ref 70–99)
Potassium: 4.2 mEq/L (ref 3.5–5.1)
Sodium: 140 mEq/L (ref 135–145)

## 2019-10-03 LAB — BRAIN NATRIURETIC PEPTIDE: Pro B Natriuretic peptide (BNP): 175 pg/mL — ABNORMAL HIGH (ref 0.0–100.0)

## 2019-10-04 ENCOUNTER — Ambulatory Visit (HOSPITAL_COMMUNITY): Payer: Medicare Other | Attending: Cardiology

## 2019-10-04 ENCOUNTER — Other Ambulatory Visit: Payer: Self-pay

## 2019-10-04 DIAGNOSIS — R0602 Shortness of breath: Secondary | ICD-10-CM

## 2019-10-08 NOTE — Progress Notes (Signed)
Called spoke with patient, advised of cxr results / recs as stated by [facility] NP.  Pt verbalized understanding and denied any questions.

## 2019-10-09 NOTE — Progress Notes (Signed)
Please let patient know echocardiogram looked fine. Mild impaired relaxation left ventricle and mild elevated pulmonary artery pressure. Unlikely to be primary cause of her shortness of breath. Follow up after PFTs.

## 2019-10-09 NOTE — Progress Notes (Signed)
Pt notified results of Echo. Pt will proceed with PFT. Nothing further needed.

## 2019-10-15 ENCOUNTER — Other Ambulatory Visit: Payer: Self-pay | Admitting: *Deleted

## 2019-10-15 DIAGNOSIS — Z122 Encounter for screening for malignant neoplasm of respiratory organs: Secondary | ICD-10-CM

## 2019-10-15 DIAGNOSIS — Z87891 Personal history of nicotine dependence: Secondary | ICD-10-CM

## 2019-10-29 ENCOUNTER — Inpatient Hospital Stay: Admission: RE | Admit: 2019-10-29 | Payer: Medicare Other | Source: Ambulatory Visit

## 2019-11-30 ENCOUNTER — Inpatient Hospital Stay (HOSPITAL_COMMUNITY): Admission: RE | Admit: 2019-11-30 | Payer: Medicare Other | Source: Ambulatory Visit

## 2019-12-01 ENCOUNTER — Other Ambulatory Visit (HOSPITAL_COMMUNITY)
Admission: RE | Admit: 2019-12-01 | Discharge: 2019-12-01 | Disposition: A | Discharge status: Home / Self Care | Payer: Medicare Other | Source: Ambulatory Visit | Attending: Internal Medicine | Admitting: Internal Medicine

## 2019-12-01 DIAGNOSIS — Z20828 Contact with and (suspected) exposure to other viral communicable diseases: Secondary | ICD-10-CM | POA: Insufficient documentation

## 2019-12-01 DIAGNOSIS — Z01812 Encounter for preprocedural laboratory examination: Secondary | ICD-10-CM | POA: Insufficient documentation

## 2019-12-01 LAB — SARS CORONAVIRUS 2 (TAT 6-24 HRS): SARS Coronavirus 2: NEGATIVE

## 2019-12-03 ENCOUNTER — Ambulatory Visit (INDEPENDENT_AMBULATORY_CARE_PROVIDER_SITE_OTHER): Payer: Medicare Other | Admitting: Internal Medicine

## 2019-12-03 ENCOUNTER — Ambulatory Visit: Payer: Medicare Other | Admitting: Internal Medicine

## 2019-12-03 ENCOUNTER — Encounter: Payer: Self-pay | Admitting: Internal Medicine

## 2019-12-03 ENCOUNTER — Other Ambulatory Visit: Payer: Self-pay

## 2019-12-03 VITALS — BP 120/78 | HR 80 | Temp 98.2°F | Ht 64.5 in | Wt 179.0 lb

## 2019-12-03 DIAGNOSIS — R0609 Other forms of dyspnea: Secondary | ICD-10-CM

## 2019-12-03 DIAGNOSIS — R0602 Shortness of breath: Secondary | ICD-10-CM | POA: Diagnosis not present

## 2019-12-03 DIAGNOSIS — G4734 Idiopathic sleep related nonobstructive alveolar hypoventilation: Secondary | ICD-10-CM | POA: Diagnosis not present

## 2019-12-03 DIAGNOSIS — J449 Chronic obstructive pulmonary disease, unspecified: Secondary | ICD-10-CM | POA: Diagnosis not present

## 2019-12-03 DIAGNOSIS — R05 Cough: Secondary | ICD-10-CM | POA: Diagnosis not present

## 2019-12-03 DIAGNOSIS — R0600 Dyspnea, unspecified: Secondary | ICD-10-CM | POA: Diagnosis not present

## 2019-12-03 DIAGNOSIS — R058 Other specified cough: Secondary | ICD-10-CM

## 2019-12-03 LAB — PULMONARY FUNCTION TEST
DL/VA % pred: 99 %
DL/VA: 4.1 ml/min/mmHg/L
DLCO unc % pred: 98 %
DLCO unc: 19.4 ml/min/mmHg
FEF 25-75 Post: 1.99 L/sec
FEF 25-75 Pre: 1.68 L/sec
FEF2575-%Change-Post: 18 %
FEF2575-%Pred-Post: 108 %
FEF2575-%Pred-Pre: 91 %
FEV1-%Change-Post: 3 %
FEV1-%Pred-Post: 107 %
FEV1-%Pred-Pre: 104 %
FEV1-Post: 2.42 L
FEV1-Pre: 2.35 L
FEV1FVC-%Change-Post: 1 %
FEV1FVC-%Pred-Pre: 96 %
FEV6-%Change-Post: 1 %
FEV6-%Pred-Post: 115 %
FEV6-%Pred-Pre: 113 %
FEV6-Post: 3.29 L
FEV6-Pre: 3.23 L
FEV6FVC-%Pred-Post: 104 %
FEV6FVC-%Pred-Pre: 104 %
FVC-%Change-Post: 1 %
FVC-%Pred-Post: 110 %
FVC-%Pred-Pre: 108 %
FVC-Post: 3.29 L
FVC-Pre: 3.23 L
Post FEV1/FVC ratio: 73 %
Post FEV6/FVC ratio: 100 %
Pre FEV1/FVC ratio: 73 %
Pre FEV6/FVC Ratio: 100 %
RV % pred: 106 %
RV: 2.41 L
TLC % pred: 112 %
TLC: 5.77 L

## 2019-12-03 NOTE — Patient Instructions (Addendum)
We need to order you a water chamber for your 02 to provide you humidity at bedtime.  Follow up will be in [id number]   to certify for 02 to see Derl Barrow

## 2019-12-03 NOTE — Progress Notes (Signed)
Full PFT performed today. °

## 2019-12-03 NOTE — Progress Notes (Signed)
Subjective:    Patient ID: Patricia Sloan, female    DOB: March 15, 1946,    MRN: [id number]    Brief patient profile:  73  yowf quit smoking 2013 with pattern of cough/ congestion/sinus infections x around 2000 with freq need for abx/pred multiple ov's and then some better for months to maybe a year before relapsing and then started on advair/ alb 2015 and still having same pattern/ similar symptoms  so referred to pulmonary clinic 11/26/2016 by Dr  Shaune Pollack    History of Present Illness  11/26/2016 1st [facility] office visit/ Patricia Sloan   Chief Complaint  Patient presents with  . Pulmonary Consult    Dr. Kevan Ny referred pt, has COPD, always SOB, caough for 6 months, uses Advair, nebulizer, and albuterol level, white mucus when coughing  sleeps poorly due to fibromyalgia and cough > sob maybre 50 % better p nebulizer despite maint rx with  advair dpi doubled dose x one month ? Worse cough > sob since increased bit mucus is minimal vol/ white Limited walking due to knee > sob  "just at the end" of a typical flare already received abx/ prednisone and still can't sleep thru the night s coughing On ppi qam maint and flonase as well    rec Stop advair Only use your albuterol as a rescue medication   Double the prilosec to where you take 40 mg(omeprazole is the generic)   Take 30-60 min before first meal of the day and Pepcid ac 20 mg daily until return  GERD (REFLUX)  Please see patient coordinator before you leave today  to schedule sinus CT Please remember to go to the lab and x-ray department downstairs for your tests - we will call you with the results when they are available.  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 Follow-up    4 week follow up COPD, has a little  cough today, she states she is doing better   no saba at all x > 24 hours and overall much better off advair but "I know it's allergies" anyway  Fatigue and aches/ not limited by doe at all / sleeping thru the noct now s any cough / did not follow instructions re gerd rx and just taking prilosec 20 mg with bfast at this point and no pepcid at all ("I've read all about these meds and I don't think they are safe") rec If any worse cough or breathing  >> Try omeprazole 40 mg Take 30-60 min before first meal of the day and add Pepcid 20 mg at bedtime and do this one week  before surgery  GERD diet . If not satisfied return to this office with all medications in hand and we will regroup.      09/12/2019 NP televisit  Patient contacted today for a follow-up via telephone visit.  Patient reports experiencing shortness of breath and cough at night. States that she cannot sleep through the night due to her breathing symptoms. She feels she may need oxygen. She uses her Albuterol rescue inhaler 1-2 times a night. Denies apneic periods.   10/02/2019 Patricia Ridges NP ov:   ONO on 09/24/19 showed oxygen desaturation to low 77% with baseline 88% on  room air.Wearing 2L at night. States that she can not function d/t her dyspnea and chronic fatigue. She gets short of breath with ADLS and showering. States that her dyspnea is so bad she has to use power chair at store to shop. Uses albuterol rescue inhaler once daily with improvement. Reports improvement in cough after stopping Advair. Feels her symptoms are mainly related to chronic fatigue syndrome. She is not interested in any form of rehab. Testing reviewed: 10/02/2019 Ambulatory walk test: Completed 3 laps at average pace, oxygen level 97% room air. Stopped half way d/t leg fatigue.  Ambulatory oxygen testing showed that you do not require oxygen during day time - Wear 2L oxygen at night - Continue Albuterol two puffs every 4-6 hours for shortness of breath/wheezing   - CXR and labs today (ordered) - Pulmonary function testing  - Echocardiogram re: dyspnea     12/03/2019  f/u ov/Patricia Sloan re:  Chief Complaint  Patient presents with  . Follow-up    Patient reports that she's doing well. She reports that wearing oxygen at night has helped her alot.   Dyspnea:  Room to room limited by back  with a cane  Cough: rattle /clear  Sleeping: R side down bed flat  SABA use: every other  02: 2lpm hs    No obvious day to day or daytime variability or assoc excess/ purulent sputum or mucus plugs or hemoptysis or cp or chest tightness, subjective wheeze or overt sinus or hb symptoms.   Sleeping as above  without nocturnal  or early am exacerbation  of respiratory  c/o's or need for noct saba. Also denies any obvious fluctuation of symptoms with weather or environmental changes or other aggravating or alleviating factors except as outlined above   No unusual exposure hx or h/o childhood pna/ asthma or knowledge of premature birth.  Current Allergies, Complete Past Medical History, Past Surgical History, Family History, and Social History were reviewed in [facility] electronic medical record.  ROS  The following are not active complaints unless bolded Hoarseness, sore throat, dysphagia, dental problems, itching, sneezing,  nasal congestion or discharge of excess mucus or purulent secretions, ear ache,   fever, chills, sweats, unintended wt loss or wt gain, classically pleuritic or exertional cp,  orthopnea pnd or arm/hand swelling  or leg swelling, presyncope, palpitations, abdominal pain, anorexia, nausea, vomiting, diarrhea  or change in bowel habits or change in bladder habits, change in stools or change in urine, dysuria, hematuria,  rash, arthralgias, visual complaints, headache, numbness, weakness or ataxia or problems with walking or coordination,  change in mood or  memory.        Current Meds  Medication Sig  . albuterol (PROVENTIL HFA;VENTOLIN HFA) 108  (90 BASE) MCG/ACT inhaler Inhale 2 puffs into the lungs every 6 (six) hours as needed for wheezing or shortness of breath.  . ALPRAZolam (XANAX) 0.5 MG tablet Take 0.5 mg by mouth at bedtime as needed. anxiety  . diphenhydrAMINE (SOMINEX) 25 MG tablet Take 25 mg by mouth daily as needed. allergies  . ergocalciferol (VITAMIN D2) 50000 UNITS capsule Take 50,000 Units by mouth once a week. on Fridays  . ezetimibe-simvastatin (VYTORIN) 10-40 MG tablet Take 1 tablet by mouth daily.  . famotidine (PEPCID) 20 MG tablet One at bedtime  . fenofibrate (TRICOR) 145 MG tablet Take 145 mg by mouth daily.  . fluticasone (FLONASE) 50 MCG/ACT nasal spray Place 2 sprays into the nose daily.  Marland Kitchen. HYDROcodone-acetaminophen (VICODIN) 5-500 MG per  tablet Take 1 tablet by mouth every 6 (six) hours as needed. For pain  . hyoscyamine (LEVSIN) 0.125 MG/5ML ELIX Take 0.125 mg by mouth.  Marland Kitchen ketorolac (TORADOL) 10 MG tablet Take 1 tablet (10 mg total) by mouth every 6 (six) hours as needed for pain (do not exceed 40 mg/day ( no greater than 4 pills/day)).  . Multiple Vitamin (MULTIVITAMIN WITH MINERALS) TABS Take 1 tablet by mouth daily.  Marland Kitchen omeprazole (PRILOSEC) 40 MG capsule Take 1 capsule (40 mg total) by mouth daily.  . pregabalin (LYRICA) 75 MG capsule Take 75 mg by mouth 2 (two) times daily.  . sertraline (ZOLOFT) 100 MG tablet Take 100 mg by mouth daily.  . simvastatin (ZOCOR) 40 MG tablet Take 40 mg by mouth daily.  . Tamsulosin HCl (FLOMAX) 0.4 MG CAPS Take 1 capsule (0.4 mg total) by mouth daily. Discontinue after stone passes.  . traMADol (ULTRAM) 50 MG tablet Take 50 mg by mouth every 6 (six) hours as needed.  . vitamin B-12 (CYANOCOBALAMIN) 1000 MCG tablet Take 1,000 mcg by mouth daily.  Marland Kitchen zolpidem (AMBIEN) 10 MG tablet Take 10 mg by mouth at bedtime as needed. For sleep              Objective:   Physical Exam    12/03/2019     179  12/27/2016      196   11/26/16 197 lb 12.8 oz (89.7 kg)  11/19/16 195  lb (88.5 kg)  02/12/15 201 lb (91.2 kg)       amb wf / slt congested sounding cough    HEENT : pt wearing mask not removed for exam due to covid -19 concerns.    NECK :  without JVD/Nodes/TM/ nl carotid upstrokes bilaterally   LUNGS: no acc muscle use,  Nl contour chest which is clear to A and P bilaterally without cough on insp or exp maneuvers   CV:  RRR  no s3 or murmur or increase in P2, and no edema   ABD: Obese soft and nontender with nl inspiratory excursion in the supine position. No bruits or organomegaly appreciated, bowel sounds nl  MS:  Nl gait/ ext warm without deformities, calf tenderness, cyanosis or clubbing No obvious joint restrictions   SKIN: warm and dry without lesions    NEURO:  alert, approp, nl sensorium with  no motor or cerebellar deficits apparent.         I personally reviewed images and agree with radiology impression as follows:  CXR:   10/02/19 Cardiac shadows within normal limits. The lungs are well aerated bilaterally. Mild persistent bronchial thickening is noted stable from the prior CT examination.     Labs ordered/ reviewed:      Chemistry      Component Value Date/Time   NA 140 10/02/2019 1636   K 4.2 10/02/2019 1636   CL 106 10/02/2019 1636   CO2 26 10/02/2019 1636   BUN 7 10/02/2019 1636   CREATININE 0.71 10/02/2019 1636      Component Value Date/Time   CALCIUM 9.3 10/02/2019 1636   ALKPHOS 63 09/17/2012 1105   AST 17 09/17/2012 1105   ALT 12 09/17/2012 1105   BILITOT 0.3 09/17/2012 1105        Lab Results  Component Value Date   WBC 9.5 10/02/2019   HGB 14.5 10/02/2019   HCT 43.9 10/02/2019   MCV 84.5 10/02/2019   PLT 203.0 10/02/2019       Lab Results  Component Value Date   PROBNP 175.0 (H) 10/02/2019       Assessment & Plan:

## 2019-12-04 ENCOUNTER — Encounter: Payer: Self-pay | Admitting: Internal Medicine

## 2019-12-04 NOTE — Assessment & Plan Note (Signed)
Quit smoking 2013 Spirometry 11/26/2016  FEV1 2.15 (90%)  Ratio 70 with only upper portion of f/v loop  truncated  on avdair ? 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 Continued dyspnea, ambulatory O2 97% RA.  - PFT's 12/03/2019 wnl x for erv 15% c/w obesity effects   > 3 min discussion I reviewed the Fletcher curve with the patient that basically indicates  if you quit smoking when your best day FEV1 is still well preserved (as is clearly  the case here)  it is highly unlikely you will progress to severe disease and informed the patient there was  no medication on the market that has proven to alter the curve/ its downward trajectory  or the likelihood of progression of their disease(unlike other chronic medical conditions such as atheroclerosis where we do think we can change the natural hx with risk reducing meds)    Therefore maintaining abstinence are  the most important aspect of her care, not choice of inhalers or for that matter, doctors.   Treatment other than smoking cessation  is entirely directed by severity of symptoms and focused also on reducing exacerbations, not attempting to change the natural history of the disease - no symptoms, no residual airflow obst or tendency to AB so prn saba all that's needed here.

## 2019-12-04 NOTE — Assessment & Plan Note (Addendum)
ONO on 09/24/19 showed O2 low 77%, baseline 88%. Spent 4 hours 14 mins <88% on room air.> rx 02 2lpm  - Echo 10/04/19 PAS around 36   - added humidity 12/03/2019   Adequate control on present rx, reviewed in detail with pt > no change in rx needed  > recheck yearly

## 2019-12-04 NOTE — Assessment & Plan Note (Signed)
Quit smoking 2013 - 10/02/2019 Ambulatory walk test: Completed 3 laps at average pace, oxygen level 97% room air. Stopped half way d/t leg fatigue.  - echo 13/24/40 mild diastolic dysfunction / PAS around 36  - PFTs 12/03/2019    wnl x ERV 15% with nl contour f/v loop   Reviewed pfts suggesting obesity is the main finding/ no pulmonary intervention needed at this point other than to continue noct 02   I had an extended discussion with the patient reviewing all relevant studies completed to date and  lasting 15 to 20 minutes of a 25 minute visit    Each maintenance medication was reviewed in detail including most importantly the difference between maintenance and prns and under what circumstances the prns are to be triggered using an action plan format that is not reflected in the computer generated alphabetically organized AVS.     Please see AVS for specific instructions unique to this visit that I personally wrote and verbalized to the the pt in detail and then reviewed with pt  by my nurse highlighting any  changes in therapy recommended at today's visit to their plan of care.

## 2019-12-04 NOTE — Assessment & Plan Note (Signed)
Onset 2000  Trial off advair .11/26/2016 > improved 12/27/2016  - Allergy profile 11/26/2016 >  Eos 0.1 /  IgE  27 neg RAST  - Sinus CT 12/02/2016 > neg  - FENO 12/27/2016  =   13 - PFT's 12/03/2019 off all rx, no obst/ nl f/v loop   Adequate control on present rx, reviewed in detail with pt > no change in rx needed

## 2020-01-24 ENCOUNTER — Ambulatory Visit: Payer: Medicare PPO | Attending: Internal Medicine

## 2020-01-24 DIAGNOSIS — Z23 Encounter for immunization: Secondary | ICD-10-CM | POA: Insufficient documentation

## 2020-01-24 NOTE — Progress Notes (Signed)
   Covid-19 Vaccination Clinic  Name:  DANEESHA QUINTEROS    MRN: [id number] DOB: [birth year]  01/24/2020  Ms. Grobe was observed post Covid-19 immunization for 15 minutes without incidence. She was provided with Vaccine Information Sheet and instruction to access the V-Safe system.   Ms. Weinrich was instructed to call 911 with any severe reactions post vaccine: Marland Kitchen Difficulty breathing  . Swelling of your face and throat  . A fast heartbeat  . A bad rash all over your body  . Dizziness and weakness    Immunizations Administered    Name Date Dose VIS Date Route   Pfizer COVID-19 Vaccine 01/24/2020  2:51 PM 0.3 mL 11/23/2019 Intramuscular   Manufacturer: [employer], [employer]   Lot: [id number]   NDC: [address]-4

## 2020-02-16 ENCOUNTER — Ambulatory Visit: Payer: Medicare PPO | Attending: Internal Medicine

## 2020-02-16 DIAGNOSIS — Z23 Encounter for immunization: Secondary | ICD-10-CM | POA: Insufficient documentation

## 2020-02-16 NOTE — Progress Notes (Signed)
   Covid-19 Vaccination Clinic  Name:  Patricia Sloan    MRN: [id number] DOB: 01-15-1946  02/16/2020  [name] was observed post Covid-19 immunization for 15 minutes without incident. She was provided with Vaccine Information Sheet and instruction to access the V-Safe system.   [name] was instructed to call 911 with any severe reactions post vaccine: Marland Kitchen Difficulty breathing  . Swelling of face and throat  . A fast heartbeat  . A bad rash all over body  . Dizziness and weakness   Immunizations Administered    Name Date Dose VIS Date Route   Pfizer COVID-19 Vaccine 02/16/2020  3:09 PM 0.3 mL 11/23/2019 Intramuscular   Manufacturer: [employer], [employer]   Lot: [id number]   NDC: [address]-0

## 2020-03-31 DIAGNOSIS — J449 Chronic obstructive pulmonary disease, unspecified: Secondary | ICD-10-CM | POA: Diagnosis not present

## 2020-03-31 DIAGNOSIS — R05 Cough: Secondary | ICD-10-CM | POA: Diagnosis not present

## 2020-04-30 DIAGNOSIS — J449 Chronic obstructive pulmonary disease, unspecified: Secondary | ICD-10-CM | POA: Diagnosis not present

## 2020-04-30 DIAGNOSIS — R05 Cough: Secondary | ICD-10-CM | POA: Diagnosis not present

## 2020-05-31 DIAGNOSIS — R05 Cough: Secondary | ICD-10-CM | POA: Diagnosis not present

## 2020-05-31 DIAGNOSIS — J449 Chronic obstructive pulmonary disease, unspecified: Secondary | ICD-10-CM | POA: Diagnosis not present

## 2020-06-30 DIAGNOSIS — R05 Cough: Secondary | ICD-10-CM | POA: Diagnosis not present

## 2020-06-30 DIAGNOSIS — J449 Chronic obstructive pulmonary disease, unspecified: Secondary | ICD-10-CM | POA: Diagnosis not present

## 2020-07-15 DIAGNOSIS — M797 Fibromyalgia: Secondary | ICD-10-CM | POA: Diagnosis not present

## 2020-07-15 DIAGNOSIS — E782 Mixed hyperlipidemia: Secondary | ICD-10-CM | POA: Diagnosis not present

## 2020-07-15 DIAGNOSIS — E1122 Type 2 diabetes mellitus with diabetic chronic kidney disease: Secondary | ICD-10-CM | POA: Diagnosis not present

## 2020-07-15 DIAGNOSIS — G720 Drug-induced myopathy: Secondary | ICD-10-CM | POA: Diagnosis not present

## 2020-07-15 DIAGNOSIS — I700 Atherosclerosis of aorta: Secondary | ICD-10-CM | POA: Diagnosis not present

## 2020-07-15 DIAGNOSIS — N183 Chronic kidney disease, stage 3 unspecified: Secondary | ICD-10-CM | POA: Diagnosis not present

## 2020-07-28 ENCOUNTER — Telehealth: Payer: Self-pay | Admitting: Rheumatology

## 2020-07-28 NOTE — Telephone Encounter (Signed)
Opened in error

## 2020-07-31 DIAGNOSIS — J449 Chronic obstructive pulmonary disease, unspecified: Secondary | ICD-10-CM | POA: Diagnosis not present

## 2020-07-31 DIAGNOSIS — R05 Cough: Secondary | ICD-10-CM | POA: Diagnosis not present

## 2020-08-31 DIAGNOSIS — J449 Chronic obstructive pulmonary disease, unspecified: Secondary | ICD-10-CM | POA: Diagnosis not present

## 2020-08-31 DIAGNOSIS — R05 Cough: Secondary | ICD-10-CM | POA: Diagnosis not present

## 2020-12-26 ENCOUNTER — Telehealth: Payer: Self-pay | Admitting: Internal Medicine

## 2020-12-26 NOTE — Telephone Encounter (Signed)
Spoke with pt, c/o increased fatigue, prod cough with yellow mucus, sinus congestion, pnd, headache X2 weeks.  Intermittent fever, night sweats.   Denies loss of taste/smell, nausea, vomiting, diarhhea.  Pt states she was around sick granddaughter X3 weeks ago.  Granddaughter took a home covid test which was negative.    I advised pt that she needs to get a covid test, reviewed local testing locations with patient.  Reviewed use of albuterol inhaler and nebulizer.  Pt is monitoring O2 sats at home and does have O2 at home for nocturnal and exertional use.  I advised pt if her sats are consistently below 88% she needs to present to ED, or if her dyspnea worsens or she does not get relief from albuterol to present to ED.  Pt expressed understanding.  Pt also states she has been contacted by DME stating that she needs to requalify for O2.  Pt is acutely ill and cannot be retested at this time.  I advised pt that we would schedule an ov with a qualifying walk when she returns to baseline to requalify for O2.  Pt expressed understanding.  Nothing further needed at this time- will close encounter.

## 2021-01-19 DIAGNOSIS — Z0000 Encounter for general adult medical examination without abnormal findings: Secondary | ICD-10-CM | POA: Diagnosis not present

## 2021-01-19 DIAGNOSIS — K589 Irritable bowel syndrome without diarrhea: Secondary | ICD-10-CM | POA: Diagnosis not present

## 2021-01-19 DIAGNOSIS — E1122 Type 2 diabetes mellitus with diabetic chronic kidney disease: Secondary | ICD-10-CM | POA: Diagnosis not present

## 2021-01-19 DIAGNOSIS — K219 Gastro-esophageal reflux disease without esophagitis: Secondary | ICD-10-CM | POA: Diagnosis not present

## 2021-01-19 DIAGNOSIS — N183 Chronic kidney disease, stage 3 unspecified: Secondary | ICD-10-CM | POA: Diagnosis not present

## 2021-01-19 DIAGNOSIS — Z1389 Encounter for screening for other disorder: Secondary | ICD-10-CM | POA: Diagnosis not present

## 2021-01-19 DIAGNOSIS — J449 Chronic obstructive pulmonary disease, unspecified: Secondary | ICD-10-CM | POA: Diagnosis not present

## 2021-01-19 DIAGNOSIS — E782 Mixed hyperlipidemia: Secondary | ICD-10-CM | POA: Diagnosis not present

## 2021-01-19 DIAGNOSIS — M797 Fibromyalgia: Secondary | ICD-10-CM | POA: Diagnosis not present

## 2021-01-28 ENCOUNTER — Ambulatory Visit: Payer: Medicare PPO | Admitting: Internal Medicine

## 2021-01-28 NOTE — Progress Notes (Deleted)
Subjective:    Patient ID: Patricia Sloan, female    DOB: March 15, 1946,    MRN: [id number]    Brief patient profile:  73  yowf quit smoking 2013 with pattern of cough/ congestion/sinus infections x around 2000 with freq need for abx/pred multiple ov's and then some better for months to maybe a year before relapsing and then started on advair/ alb 2015 and still having same pattern/ similar symptoms  so referred to pulmonary clinic 11/26/2016 by Dr  Shaune Pollack    History of Present Illness  11/26/2016 1st [facility] office visit/ Patricia Sloan   Chief Complaint  Patient presents with  . Pulmonary Consult    Dr. Kevan Ny referred pt, has COPD, always SOB, caough for 6 months, uses Advair, nebulizer, and albuterol level, white mucus when coughing  sleeps poorly due to fibromyalgia and cough > sob maybre 50 % better p nebulizer despite maint rx with  advair dpi doubled dose x one month ? Worse cough > sob since increased bit mucus is minimal vol/ white Limited walking due to knee > sob  "just at the end" of a typical flare already received abx/ prednisone and still can't sleep thru the night s coughing On ppi qam maint and flonase as well    rec Stop advair Only use your albuterol as a rescue medication   Double the prilosec to where you take 40 mg(omeprazole is the generic)   Take 30-60 min before first meal of the day and Pepcid ac 20 mg daily until return  GERD (REFLUX)  Please see patient coordinator before you leave today  to schedule sinus CT Please remember to go to the lab and x-ray department downstairs for your tests - we will call you with the results when they are available.  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 Follow-up    4 week follow up COPD, has a little  cough today, she states she is doing better   no saba at all x > 24 hours and overall much better off advair but "I know it's allergies" anyway  Fatigue and aches/ not limited by doe at all / sleeping thru the noct now s any cough / did not follow instructions re gerd rx and just taking prilosec 20 mg with bfast at this point and no pepcid at all ("I've read all about these meds and I don't think they are safe") rec If any worse cough or breathing  >> Try omeprazole 40 mg Take 30-60 min before first meal of the day and add Pepcid 20 mg at bedtime and do this one week  before surgery  GERD diet . If not satisfied return to this office with all medications in hand and we will regroup.      09/12/2019 NP televisit  Patient contacted today for a follow-up via telephone visit.  Patient reports experiencing shortness of breath and cough at night. States that she cannot sleep through the night due to her breathing symptoms. She feels she may need oxygen. She uses her Albuterol rescue inhaler 1-2 times a night. Denies apneic periods.   10/02/2019 [name] NP ov:   ONO on 09/24/19 showed oxygen desaturation to low 77% with baseline 88% on  room air.Wearing 2L at night. States that she can not function d/t her dyspnea and chronic fatigue. She gets short of breath with ADLS and showering. States that her dyspnea is so bad she has to use power chair at store to shop. Uses albuterol rescue inhaler once daily with improvement. Reports improvement in cough after stopping Advair. Feels her symptoms are mainly related to chronic fatigue syndrome. She is not interested in any form of rehab. Testing reviewed: 10/02/2019 Ambulatory walk test: Completed 3 laps at average pace, oxygen level 97% room air. Stopped half way d/t leg fatigue.  Ambulatory oxygen testing showed that you do not require oxygen during day time - Wear 2L oxygen at night - Continue Albuterol two puffs every 4-6 hours for shortness of breath/wheezing   - CXR and labs today (ordered) - Pulmonary function testing  - Echocardiogram re: dyspnea     12/03/2019  f/u ov/Patricia Sloan re:  Chief Complaint  Patient presents with  . Follow-up    Patient reports that she's doing well. She reports that wearing oxygen at night has helped her alot.   Dyspnea:  Room to room limited by back  with a cane  Cough: rattle /clear  Sleeping: R side down bed flat  SABA use: every other  02: 2lpm hs  rec We need to order you a water chamber for your 02 to provide you humidity at bedtime. Follow up will be in 09/2020   to certify for 02 to see Buelah Manis   01/28/2021  f/u ov/Patricia Sloan re:  No chief complaint on file.   Dyspnea:  *** Cough: *** Sleeping: *** SABA use: *** 02: *** Covid status:   ***   No obvious day to day or daytime variability or assoc excess/ purulent sputum or mucus plugs or hemoptysis or cp or chest tightness, subjective wheeze or overt sinus or hb symptoms.   *** without nocturnal  or early am exacerbation  of respiratory  c/o's or need for noct saba. Also denies any obvious fluctuation of symptoms with weather or environmental changes or other aggravating or alleviating factors except as outlined above   No unusual exposure hx or h/o childhood pna/ asthma or knowledge of premature birth.  Current Allergies, Complete Past Medical History, Past Surgical History, Family History, and Social History were reviewed in [employer] record.  ROS  The following are not active complaints unless bolded Hoarseness, sore throat, dysphagia, dental problems, itching, sneezing,  nasal congestion or discharge of excess mucus or purulent secretions, ear ache,   fever, chills, sweats, unintended wt loss or wt gain, classically pleuritic or exertional cp,  orthopnea pnd or arm/hand swelling  or leg swelling, presyncope, palpitations, abdominal pain, anorexia, nausea, vomiting, diarrhea  or change in bowel habits or change in bladder  habits, change in stools or change in urine, dysuria, hematuria,  rash, arthralgias, visual complaints, headache, numbness, weakness or ataxia or problems with walking or coordination,  change in mood or  memory.        No outpatient medications have been marked as taking for the 01/28/21 encounter (Appointment) with Nyoka Cowden, MD.               Objective:   Physical Exam   01/28/2021        *** 12/03/2019     179  12/27/2016      196   11/26/16 197 lb 12.8 oz (89.7 kg)  11/19/16 195 lb (88.5 kg)  02/12/15 201  lb (91.2 kg)    Vital signs reviewed  01/28/2021  - Note at rest 02 sats  ***% on ***   General appearance:    ***                       Assessment & Plan:

## 2021-02-10 ENCOUNTER — Ambulatory Visit (INDEPENDENT_AMBULATORY_CARE_PROVIDER_SITE_OTHER): Payer: Medicare PPO

## 2021-02-10 ENCOUNTER — Ambulatory Visit: Payer: Medicare PPO | Admitting: Internal Medicine

## 2021-02-10 ENCOUNTER — Encounter: Payer: Self-pay | Admitting: Internal Medicine

## 2021-02-10 ENCOUNTER — Other Ambulatory Visit: Payer: Self-pay

## 2021-02-10 DIAGNOSIS — R058 Other specified cough: Secondary | ICD-10-CM | POA: Diagnosis not present

## 2021-02-10 DIAGNOSIS — R0602 Shortness of breath: Secondary | ICD-10-CM | POA: Diagnosis not present

## 2021-02-10 DIAGNOSIS — R0600 Dyspnea, unspecified: Secondary | ICD-10-CM | POA: Diagnosis not present

## 2021-02-10 DIAGNOSIS — R0609 Other forms of dyspnea: Secondary | ICD-10-CM

## 2021-02-10 DIAGNOSIS — J449 Chronic obstructive pulmonary disease, unspecified: Secondary | ICD-10-CM | POA: Diagnosis not present

## 2021-02-10 DIAGNOSIS — G4734 Idiopathic sleep related nonobstructive alveolar hypoventilation: Secondary | ICD-10-CM

## 2021-02-10 NOTE — Patient Instructions (Addendum)
Please remember to go to the  x-ray department  for your tests - we will call you with the results when they are available    Make sure you check your oxygen saturation at your highest level of activity to be sure it stays over 90% and keep track of it at least once a week, more often if breathing getting worse, and let me know if losing ground.   Only use your albuterol as a rescue medication to be used if you can't catch your breath by resting or doing a relaxed purse lip breathing pattern.  - The less you use it, the better it will work when you need it. - Ok to use up to 2 puffs  every 4 hours if you must but call for immediate appointment if use goes up over your usual need - Don't leave home without it !!  (think of it like the spare tire for your car)   Ok also to Try albuterol 15 min before an activity that you know would make you short of breath and see if it makes any difference and if makes none then don't take it after activity unless you can't catch your breath.      Late add ? ild / chf on cxr so rec bnp,esr, cbc with diff

## 2021-02-10 NOTE — Progress Notes (Addendum)
Subjective:    Patient ID: Patricia Sloan, female    DOB: 1946/08/19    MRN: [id number]    Brief patient profile:  53  yowf quit smoking 2013 with pattern of cough/ congestion/sinus infections x around 2000 with freq need for abx/pred multiple ov's and then some better for months to maybe a year before relapsing and then started on advair/ alb 2015 and still having same pattern/ similar symptoms  so referred to pulmonary clinic 11/26/2016 by Dr  Shaune Pollack    History of Present Illness  11/26/2016 1st [facility] office visit/ Patricia Sloan   Chief Complaint  Patient presents with  . Pulmonary Consult    Dr. Kevan Ny referred pt, has COPD, always SOB, caough for 6 months, uses Advair, nebulizer, and albuterol level, white mucus when coughing  sleeps poorly due to fibromyalgia and cough > sob maybre 50 % better p nebulizer despite maint rx with  advair dpi doubled dose x one month ? Worse cough > sob since increased bit mucus is minimal vol/ white Limited walking due to knee > sob  "just at the end" of a typical flare already received abx/ prednisone and still can't sleep thru the night s coughing On ppi qam maint and flonase as well    rec Stop advair Only use your albuterol as a rescue medication   Double the prilosec to where you take 40 mg(omeprazole is the generic)   Take 30-60 min before first meal of the day and Pepcid ac 20 mg daily until return  GERD (REFLUX)  Please see patient coordinator before you leave today  to schedule sinus CT Please remember to go to the lab and x-ray department downstairs for your tests - we will call you with the results when they are available.  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 Follow-up    4 week follow up COPD, has a little  cough today, she states she is doing better   no saba at all x > 24 hours and overall much better off advair but "I know it's allergies" anyway  Fatigue and aches/ not limited by doe at all / sleeping thru the noct now s any cough / did not follow instructions re gerd rx and just taking prilosec 20 mg with bfast at this point and no pepcid at all ("I've read all about these meds and I don't think they are safe") rec If any worse cough or breathing  >> Try omeprazole 40 mg Take 30-60 min before first meal of the day and add Pepcid 20 mg at bedtime and do this one week  before surgery  GERD diet . If not satisfied return to this office with all medications in hand and we will regroup.      09/12/2019 NP televisit  Patient contacted today for a follow-up via telephone visit.  Patient reports experiencing shortness of breath and cough at night. States that she cannot sleep through the night due to her breathing symptoms. She feels she may need oxygen. She uses her Albuterol rescue inhaler 1-2 times a night. Denies apneic periods.   10/02/2019 Patricia Ridges NP ov:   ONO on 09/24/19 showed oxygen desaturation to low 77% with baseline 88% on  room air.Wearing 2L at night. States that she can not function d/t her dyspnea and chronic fatigue. She gets short of breath with ADLS and showering. States that her dyspnea is so bad she has to use power chair at store to shop. Uses albuterol rescue inhaler once daily with improvement. Reports improvement in cough after stopping Advair. Feels her symptoms are mainly related to chronic fatigue syndrome. She is not interested in any form of rehab. Testing reviewed: 10/02/2019 Ambulatory walk test: Completed 3 laps at average pace, oxygen level 97% room air. Stopped half way d/t leg fatigue.  Ambulatory oxygen testing showed that you do not require oxygen during day time - Wear 2L oxygen at night - Continue Albuterol two puffs every 4-6 hours for shortness of breath/wheezing   - CXR and labs today (ordered) - Pulmonary function testing  - Echocardiogram re: dyspnea     12/03/2019  f/u ov/Patricia Sloan re: GOLD0  Chief Complaint  Patient presents with  . Follow-up    Patient reports that she's doing well. She reports that wearing oxygen at night has helped her alot.   Dyspnea:  Room to room limited by back  with a cane  Cough: rattle /clear  Sleeping: R side down bed flat  SABA use: every other  02: 2lpm hs  rec We need to order you a water chamber for your 02 to provide you humidity at bedtime. Follow up will be in 09/2020   to certify for 02 to see Buelah Manis   02/10/2021  f/u ov/Patricia Sloan re:  GOLD 0 /  Chief Complaint  Patient presents with  . Follow-up    Pt states she got sick in Jan 2022- fatigue, cough and sinus pressure, fever. She tested neg for covid. She states that her symptoms have since resolved. She needs to recert for nocturnal o2.    Dyspnea:  50 ft with cane due to fatigue R knee and back pain  Cough: no longer / nose stuffy still since Jan 2022 / has seen ENT in past and has nose bleeds as well but has not returne Sleeping:   bed is flat 2 pillows  SABA use: not using now  02: 2lpm hs humidified  Covid status:   vax x 3    No obvious day to day or daytime variability or assoc excess/ purulent sputum or mucus plugs or hemoptysis or cp or chest tightness, subjective wheeze or overt   hb symptoms.   Sleeping  without nocturnal  or early am exacerbation  of respiratory  c/o's or need for noct saba. Also denies any obvious fluctuation of symptoms with weather or environmental changes or other aggravating or alleviating factors except as outlined above   No unusual exposure hx or h/o childhood pna/ asthma or knowledge of premature birth.  Current Allergies, Complete Past Medical History, Past Surgical History, Family History, and Social History were reviewed in [employer] record.  ROS  The following are not active complaints  unless bolded Hoarseness, sore throat, dysphagia, dental problems, itching, sneezing,  nasal congestion or discharge of excess mucus or purulent secretions, ear ache,   fever, chills, sweats, unintended wt loss or wt gain, classically pleuritic or exertional cp,  orthopnea pnd or arm/hand swelling  or leg swelling, presyncope, palpitations, abdominal pain, anorexia, nausea, vomiting, diarrhea  or change in bowel habits or change in bladder habits, change in stools or change in urine, dysuria, hematuria,  rash, arthralgias, visual complaints, headache, numbness, weakness or ataxia  or problems with walking or coordination,  change in mood or  memory.        Current Meds  Medication Sig  . albuterol (PROVENTIL HFA;VENTOLIN HFA) 108 (90 BASE) MCG/ACT inhaler Inhale 2 puffs into the lungs every 6 (six) hours as needed for wheezing or shortness of breath.  . ALPRAZolam (XANAX) 0.5 MG tablet Take 0.5 mg by mouth at bedtime as needed. anxiety  . diphenhydrAMINE (SOMINEX) 25 MG tablet Take 25 mg by mouth daily as needed. allergies  . ergocalciferol (VITAMIN D2) 50000 UNITS capsule Take 50,000 Units by mouth once a week. on Fridays  . ezetimibe-simvastatin (VYTORIN) 10-40 MG tablet Take 1 tablet by mouth daily.  . famotidine (PEPCID) 20 MG tablet One at bedtime  . fenofibrate (TRICOR) 145 MG tablet Take 145 mg by mouth daily.  . fluticasone (FLONASE) 50 MCG/ACT nasal spray Place 2 sprays into the nose daily.  Marland Kitchen. HYDROcodone-acetaminophen (VICODIN) 5-500 MG per tablet Take 1 tablet by mouth every 6 (six) hours as needed. For pain  . hyoscyamine (LEVSIN) 0.125 MG/5ML ELIX Take 0.125 mg by mouth.  Marland Kitchen. ketorolac (TORADOL) 10 MG tablet Take 1 tablet (10 mg total) by mouth every 6 (six) hours as needed for pain (do not exceed 40 mg/day ( no greater than 4 pills/day)).  . Multiple Vitamin (MULTIVITAMIN WITH MINERALS) TABS Take 1 tablet by mouth daily.  Marland Kitchen. omeprazole (PRILOSEC) 40 MG capsule Take 1 capsule (40 mg total)  by mouth daily.  . pregabalin (LYRICA) 75 MG capsule Take 75 mg by mouth 2 (two) times daily.  . sertraline (ZOLOFT) 100 MG tablet Take 100 mg by mouth daily.  . simvastatin (ZOCOR) 40 MG tablet Take 40 mg by mouth daily.  . Tamsulosin HCl (FLOMAX) 0.4 MG CAPS Take 1 capsule (0.4 mg total) by mouth daily. Discontinue after stone passes.  . traMADol (ULTRAM) 50 MG tablet Take 50 mg by mouth every 6 (six) hours as needed.  . vitamin B-12 (CYANOCOBALAMIN) 1000 MCG tablet Take 1,000 mcg by mouth daily.               Objective:   Physical Exam   02/10/2021         183 12/03/2019     179  12/27/2016      196   11/26/16 197 lb 12.8 oz (89.7 kg)  11/19/16 195 lb (88.5 kg)  02/12/15 201 lb (91.2 kg)    Vital signs reviewed  02/10/2021  - Note at rest 02 sats  97% on RA   General appearance:    amb white  Female walks with cane    HEENT : pt wearing mask not removed for exam due to covid -19 concerns.    NECK :  without JVD/Nodes/TM/ nl carotid upstrokes bilaterally   LUNGS: no acc muscle use,  Nl contour chest which is clear to A and P bilaterally without cough on insp or exp maneuvers   CV:  RRR  no s3 or murmur or increase in P2, and no edema   ABD:  soft and nontender with nl inspiratory excursion in the supine position. No bruits or organomegaly appreciated, bowel sounds nl  MS:  Awkward gait/ ext warm without deformities, calf tenderness, cyanosis or clubbing No obvious joint restrictions   SKIN: warm and dry without lesions    NEURO:  alert, approp, nl sensorium with  no motor or cerebellar deficits apparent.     CXR PA and Lateral:   02/10/2021 :  I personally reviewed images and agree with radiology impression as follows:    Mild, diffuse interstitial pulmonary opacity, suggestive of mild edema or atypical/viral infection. No focal airspace opacity      Assessment & Plan:

## 2021-02-11 ENCOUNTER — Encounter: Payer: Self-pay | Admitting: Internal Medicine

## 2021-02-11 NOTE — Assessment & Plan Note (Signed)
ONO on 09/24/19 showed O2 low 77%, baseline 88%. Spent 4 hours 14 mins <88% on room air.> rx 02 2lpm  - Echo 10/04/19 PAS around 36  - added humidity 12/03/2019  - 02/10/2021   Walked on RA x one lap =  approx 250 ft -@ slow with cane, slt unsteady pace stopped due to tired/ sob with sats 96%  - ONO RA 02/10/2021  rec (may need formal sleep medicine eval.  No evidence of a pulmonary limitation to exercise or need for daytime 02 at this point  Pulmonary f/u can be prn   Each maintenance medication was reviewed in detail including emphasizing most importantly the difference between maintenance and prns and under what circumstances the prns are to be triggered using an action plan format where appropriate.  Total time for H and P, chart review, counseling,  directly observing portions of ambulatory 02 saturation study/ and generating customized AVS unique to this office visit / same day charting = 27 min

## 2021-02-11 NOTE — Assessment & Plan Note (Signed)
Onset 2000  Trial off advair .11/26/2016 > improved 12/27/2016  - Allergy profile 11/26/2016 >  Eos 0.1 /  IgE  27 neg RAST  - Sinus CT 12/02/2016 > neg  - FENO 12/27/2016  =   13 - PFT's 12/03/2019 off all rx, no obst/ nl f/v loop   Upper airway cough syndrome (previously labeled PNDS),  is so named because it's frequently impossible to sort out how much is  CR/sinusitis with freq throat clearing (which can be related to primary GERD)   vs  causing  secondary (" extra esophageal")  GERD from wide swings in gastric pressure that occur with throat clearing, often  promoting self use of mint and menthol lozenges that reduce the lower esophageal sphincter tone and exacerbate the problem further in a cyclical fashion.   These are the same pts (now being labeled as having "irritable larynx syndrome" by some cough centers) who not infrequently have a history of having failed to tolerate ace inhibitors,  dry powder inhalers or biphosphonates or report having atypical/extraesophageal reflux symptoms that don't respond to standard doses of PPI  and are easily confused as having aecopd or asthma flares by even experienced allergists/ pulmonologists (myself included).    >>>   referred back to ENT 02/10/2021

## 2021-02-11 NOTE — Assessment & Plan Note (Signed)
Quit smoking 2013 Spirometry 11/26/2016  FEV1 2.15 (90%)  Ratio 70 with only upper portion of f/v loop  truncated  on avdair ? 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 Continued dyspnea, ambulatory O2 97% RA.  - PFT's 12/03/2019 wnl x for erv 15% c/w obesity effects  - 02/10/2021  After extensive coaching inhaler device,  effectiveness =    75% from a baseline of 50%  Still little evidence for any airway problem here so ok to just use saba prn  I spent extra time with pt today reviewing appropriate use of albuterol for prn use on exertion with the following points: 1) saba is for relief of sob that does not improve by walking a slower pace or resting but rather if the pt does not improve after trying this first. 2) If the pt is convinced, as many are, that saba helps recover from activity faster then it's easy to tell if this is the case by re-challenging : ie stop, take the inhaler, then p 5 minutes try the exact same activity (intensity of workload) that just caused the symptoms and see if they are substantially diminished or not after saba 3) if there is an activity that reproducibly causes the symptoms, try the saba 15 min before the activity on alternate days   If in fact the saba really does help, then fine to continue to use it prn but advised may need to look closer at the maintenance regimen being used to achieve better control of airways disease with exertion.

## 2021-02-11 NOTE — Assessment & Plan Note (Addendum)
Quit smoking 2013 - 10/02/2019 Ambulatory walk test: Completed 3 laps at average pace, oxygen level 97% room air. Stopped half way d/t leg fatigue.  - echo 10/04/19 mild diastolic dysfunction / PAS around 36  - PFTs 12/03/2019    wnl x ERV 15% with nl contour f/v loop : findings c/w obesity   Did not desaturate 02/10/2021 / eval c/w obesity and deconditioning   Add:  cxr suggests ILD or chf to radiology so reasonable to do labs for chf and esr for ? Underlying collagen vasc dz and HRCT if sats drop with continued monitoring  

## 2021-02-12 NOTE — Progress Notes (Signed)
Tried calling the pt and there was no answer and her VM memory was full so will try calling back later.

## 2021-02-12 NOTE — Addendum Note (Signed)
Addended by: Sandrea Hughs B on: 02/12/2021 08:40 AM   Modules accepted: Orders

## 2021-02-19 NOTE — Progress Notes (Signed)
Spoke with pt and notified of results per Dr. Wert. Pt verbalized understanding and denied any questions. 

## 2021-03-05 DIAGNOSIS — G4730 Sleep apnea, unspecified: Secondary | ICD-10-CM | POA: Diagnosis not present

## 2021-03-05 DIAGNOSIS — R0683 Snoring: Secondary | ICD-10-CM | POA: Diagnosis not present

## 2021-03-26 IMAGING — DX DG CHEST 2V
2 series · 2 of 2 positions shown · non-contrast
Comparison: 06/23/2018

CLINICAL DATA: Dyspnea

EXAM:
CHEST - 2 VIEW

[chest pa]
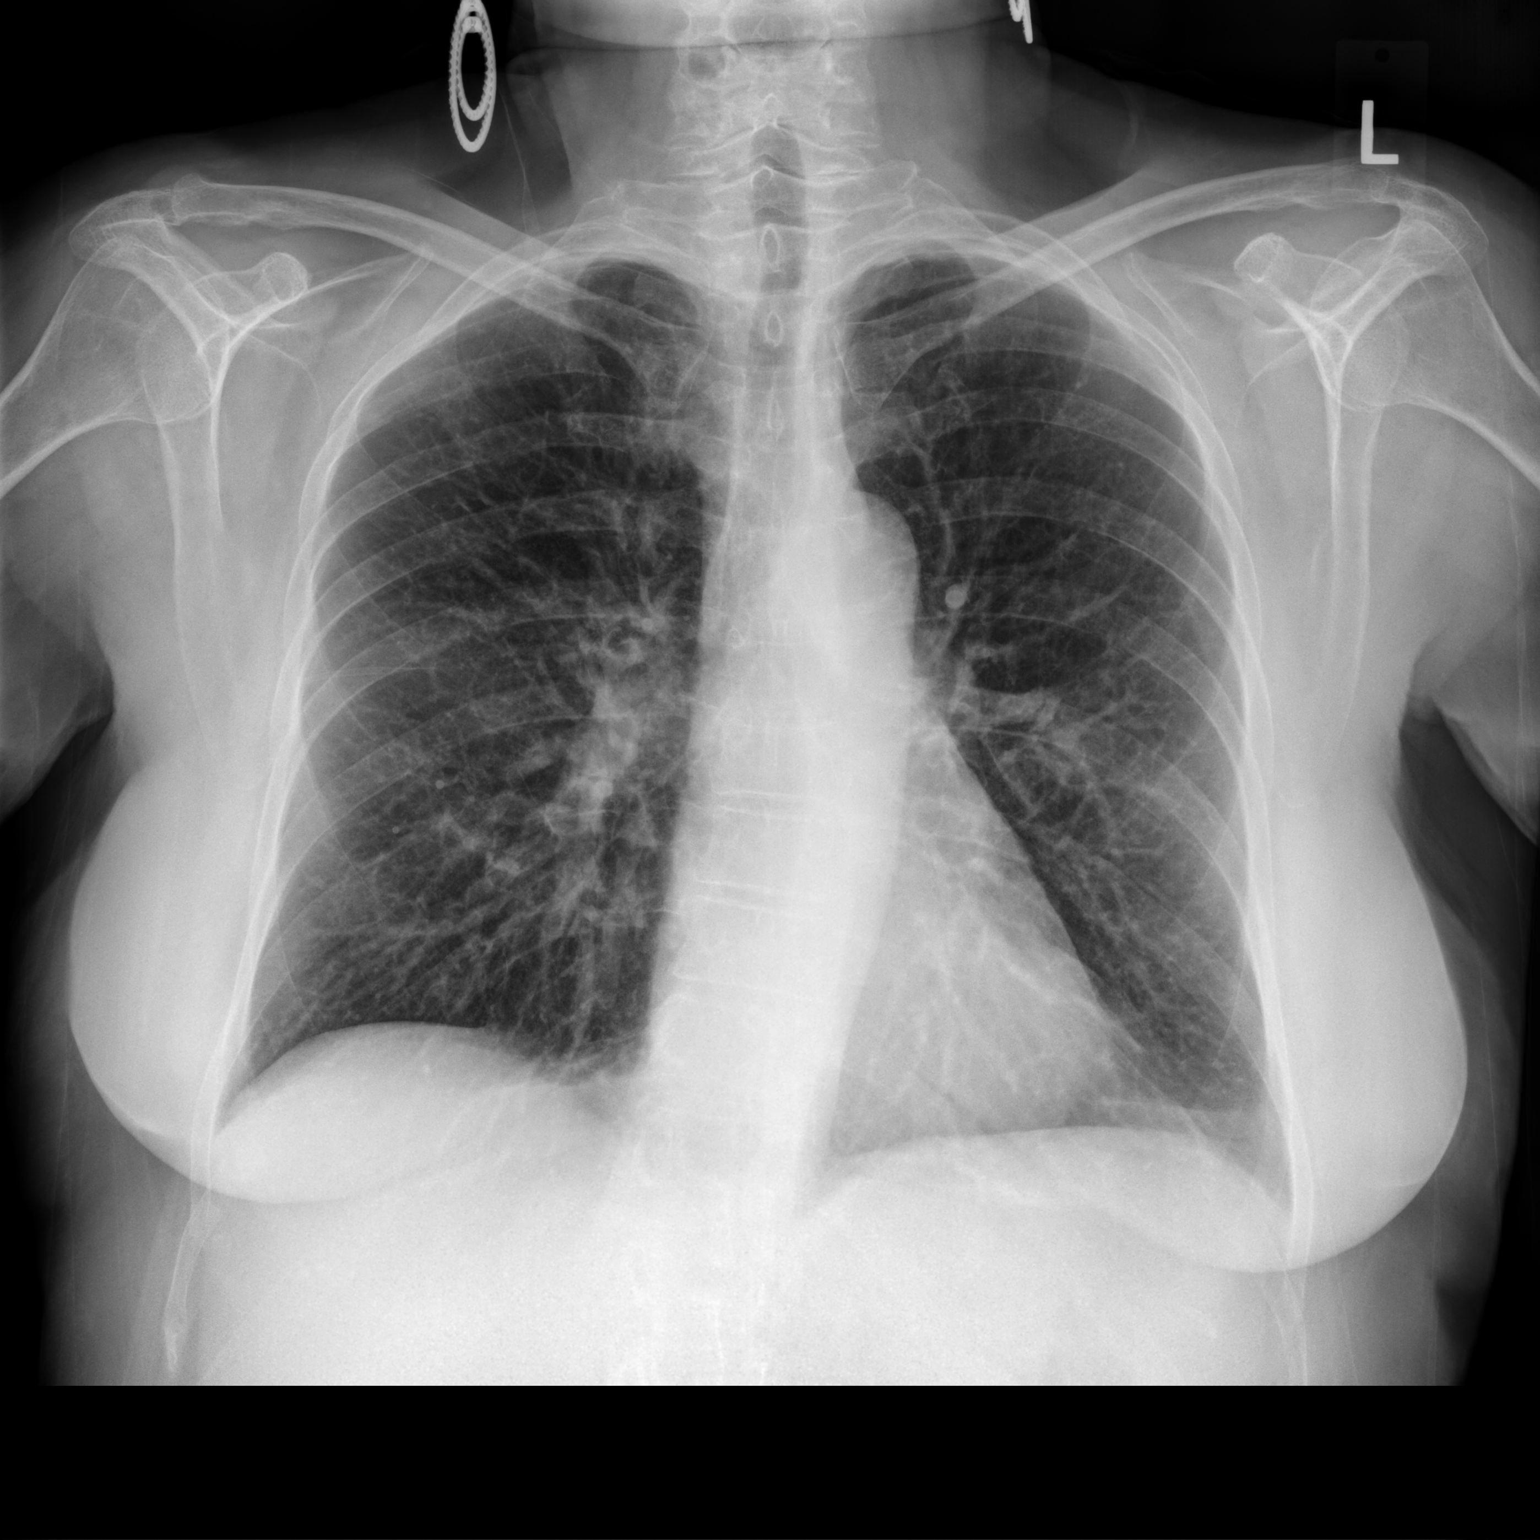

[chest lat]
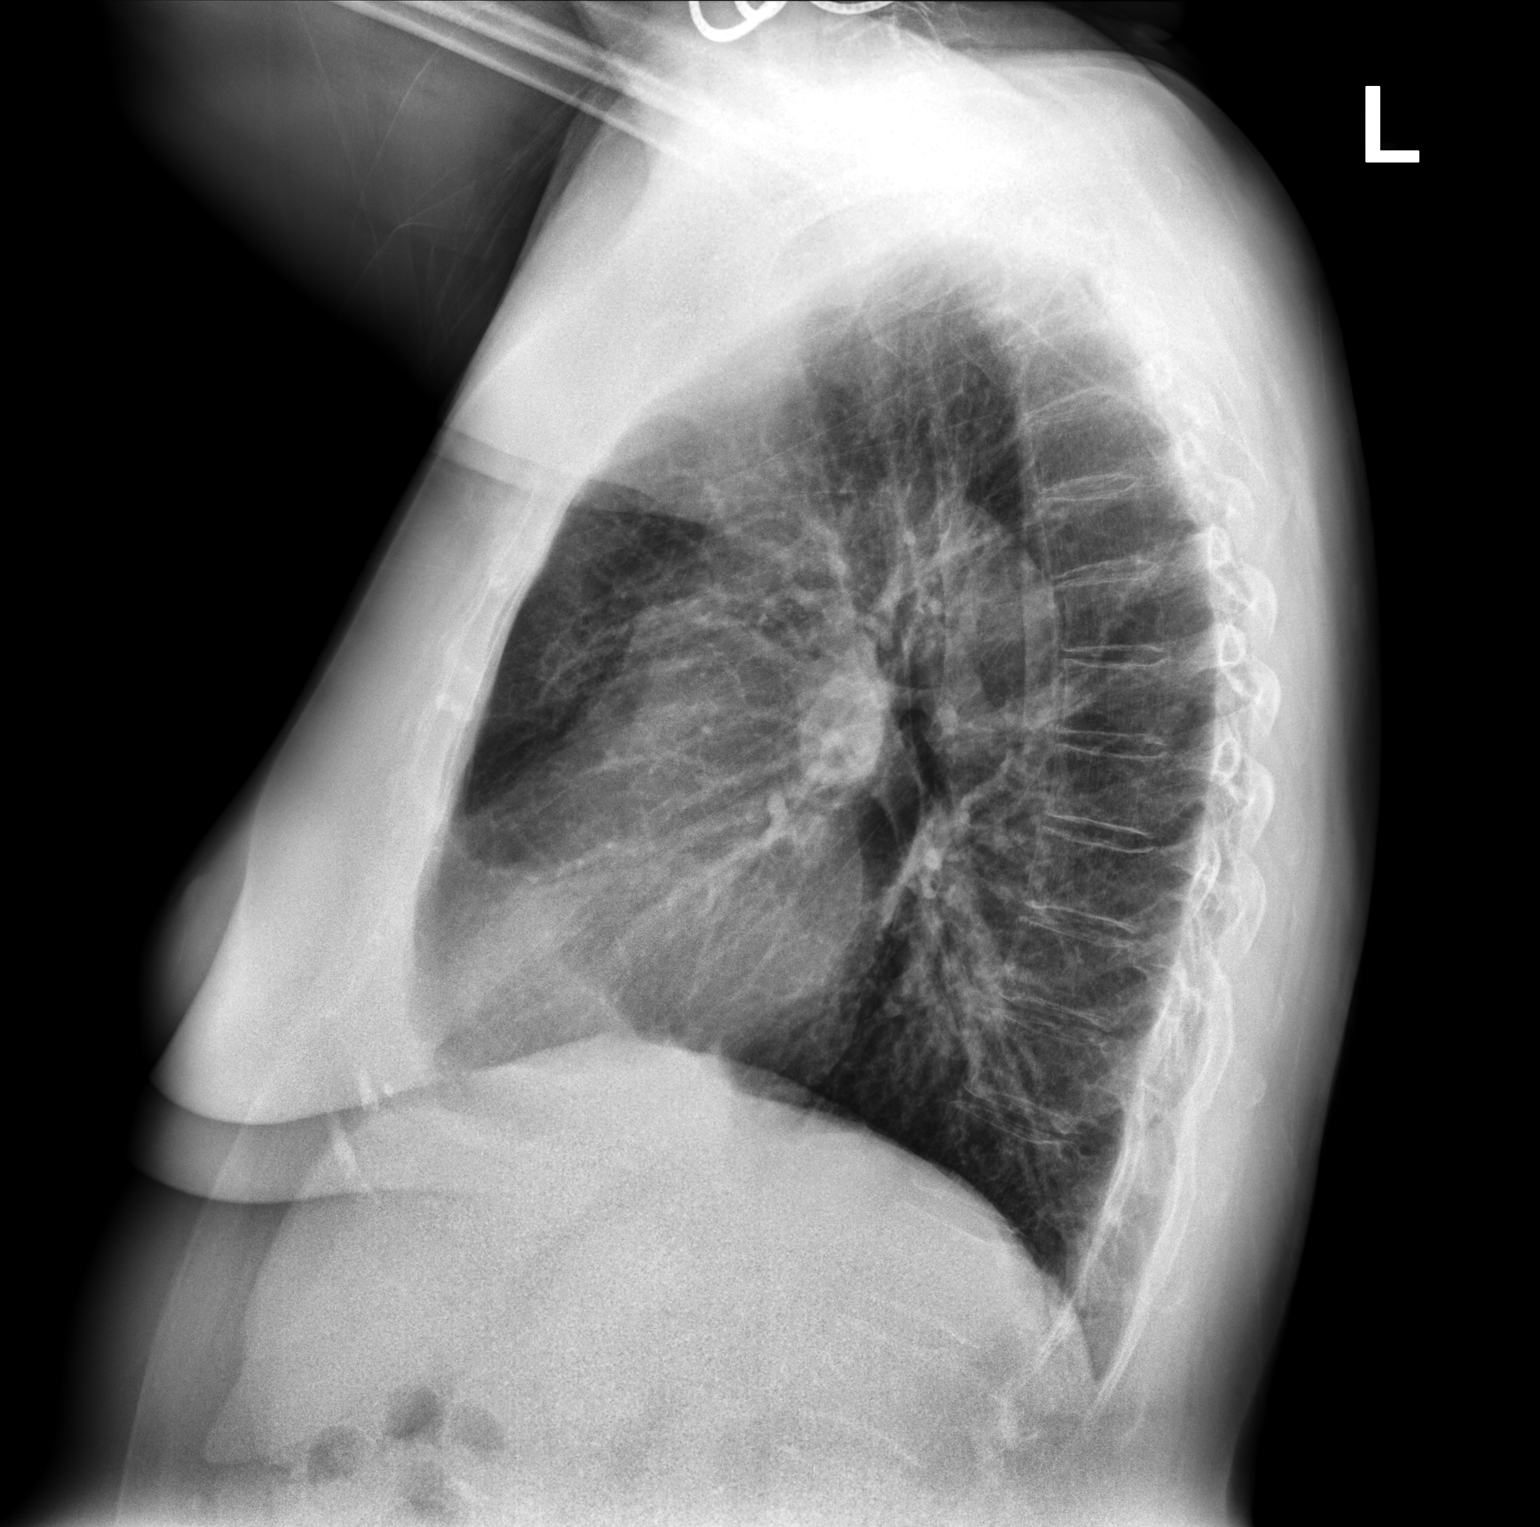

[2 of 2 positions shown; findings below may reference images not displayed]

FINDINGS: Cardiac shadows within normal limits. The lungs are well aerated
bilaterally. Mild persistent bronchial thickening is noted stable
from the prior CT examination. No focal infiltrate or effusion is
seen. No bony abnormality is noted.
IMPRESSION: Chronic changes without acute abnormality.

## 2021-04-30 DIAGNOSIS — J449 Chronic obstructive pulmonary disease, unspecified: Secondary | ICD-10-CM | POA: Diagnosis not present

## 2021-05-31 DIAGNOSIS — J449 Chronic obstructive pulmonary disease, unspecified: Secondary | ICD-10-CM | POA: Diagnosis not present

## 2021-06-30 DIAGNOSIS — J449 Chronic obstructive pulmonary disease, unspecified: Secondary | ICD-10-CM | POA: Diagnosis not present

## 2021-07-22 DIAGNOSIS — M5416 Radiculopathy, lumbar region: Secondary | ICD-10-CM | POA: Diagnosis not present

## 2021-07-29 DIAGNOSIS — E782 Mixed hyperlipidemia: Secondary | ICD-10-CM | POA: Diagnosis not present

## 2021-07-29 DIAGNOSIS — E1122 Type 2 diabetes mellitus with diabetic chronic kidney disease: Secondary | ICD-10-CM | POA: Diagnosis not present

## 2021-07-29 DIAGNOSIS — E669 Obesity, unspecified: Secondary | ICD-10-CM | POA: Diagnosis not present

## 2021-07-29 DIAGNOSIS — F17290 Nicotine dependence, other tobacco product, uncomplicated: Secondary | ICD-10-CM | POA: Diagnosis not present

## 2021-07-29 DIAGNOSIS — M549 Dorsalgia, unspecified: Secondary | ICD-10-CM | POA: Diagnosis not present

## 2021-07-29 DIAGNOSIS — F331 Major depressive disorder, recurrent, moderate: Secondary | ICD-10-CM | POA: Diagnosis not present

## 2021-07-31 DIAGNOSIS — J449 Chronic obstructive pulmonary disease, unspecified: Secondary | ICD-10-CM | POA: Diagnosis not present

## 2021-08-31 DIAGNOSIS — J449 Chronic obstructive pulmonary disease, unspecified: Secondary | ICD-10-CM | POA: Diagnosis not present

## 2021-09-01 DIAGNOSIS — M5416 Radiculopathy, lumbar region: Secondary | ICD-10-CM | POA: Diagnosis not present

## 2021-09-22 DIAGNOSIS — M797 Fibromyalgia: Secondary | ICD-10-CM | POA: Diagnosis not present

## 2021-09-22 DIAGNOSIS — M5416 Radiculopathy, lumbar region: Secondary | ICD-10-CM | POA: Diagnosis not present

## 2021-09-30 DIAGNOSIS — J449 Chronic obstructive pulmonary disease, unspecified: Secondary | ICD-10-CM | POA: Diagnosis not present

## 2021-10-08 DIAGNOSIS — F329 Major depressive disorder, single episode, unspecified: Secondary | ICD-10-CM | POA: Diagnosis not present

## 2021-10-08 DIAGNOSIS — G8929 Other chronic pain: Secondary | ICD-10-CM | POA: Diagnosis not present

## 2021-10-08 DIAGNOSIS — E119 Type 2 diabetes mellitus without complications: Secondary | ICD-10-CM | POA: Diagnosis not present

## 2021-10-08 DIAGNOSIS — F419 Anxiety disorder, unspecified: Secondary | ICD-10-CM | POA: Diagnosis not present

## 2021-10-08 DIAGNOSIS — I700 Atherosclerosis of aorta: Secondary | ICD-10-CM | POA: Diagnosis not present

## 2021-10-08 DIAGNOSIS — E785 Hyperlipidemia, unspecified: Secondary | ICD-10-CM | POA: Diagnosis not present

## 2021-10-08 DIAGNOSIS — K219 Gastro-esophageal reflux disease without esophagitis: Secondary | ICD-10-CM | POA: Diagnosis not present

## 2021-10-08 DIAGNOSIS — E669 Obesity, unspecified: Secondary | ICD-10-CM | POA: Diagnosis not present

## 2021-10-08 DIAGNOSIS — J449 Chronic obstructive pulmonary disease, unspecified: Secondary | ICD-10-CM | POA: Diagnosis not present

## 2021-10-20 DIAGNOSIS — J189 Pneumonia, unspecified organism: Secondary | ICD-10-CM | POA: Diagnosis not present

## 2021-10-20 DIAGNOSIS — B349 Viral infection, unspecified: Secondary | ICD-10-CM | POA: Diagnosis not present

## 2021-10-20 DIAGNOSIS — K580 Irritable bowel syndrome with diarrhea: Secondary | ICD-10-CM | POA: Diagnosis not present

## 2021-10-23 DIAGNOSIS — R059 Cough, unspecified: Secondary | ICD-10-CM | POA: Diagnosis not present

## 2022-01-25 DIAGNOSIS — Z0000 Encounter for general adult medical examination without abnormal findings: Secondary | ICD-10-CM | POA: Diagnosis not present

## 2022-01-25 DIAGNOSIS — E559 Vitamin D deficiency, unspecified: Secondary | ICD-10-CM | POA: Diagnosis not present

## 2022-01-25 DIAGNOSIS — F331 Major depressive disorder, recurrent, moderate: Secondary | ICD-10-CM | POA: Diagnosis not present

## 2022-01-25 DIAGNOSIS — Z1159 Encounter for screening for other viral diseases: Secondary | ICD-10-CM | POA: Diagnosis not present

## 2022-01-25 DIAGNOSIS — I700 Atherosclerosis of aorta: Secondary | ICD-10-CM | POA: Diagnosis not present

## 2022-01-25 DIAGNOSIS — J449 Chronic obstructive pulmonary disease, unspecified: Secondary | ICD-10-CM | POA: Diagnosis not present

## 2022-01-25 DIAGNOSIS — E782 Mixed hyperlipidemia: Secondary | ICD-10-CM | POA: Diagnosis not present

## 2022-01-25 DIAGNOSIS — E1122 Type 2 diabetes mellitus with diabetic chronic kidney disease: Secondary | ICD-10-CM | POA: Diagnosis not present

## 2022-01-25 DIAGNOSIS — H9190 Unspecified hearing loss, unspecified ear: Secondary | ICD-10-CM | POA: Diagnosis not present

## 2022-02-08 DIAGNOSIS — Z1211 Encounter for screening for malignant neoplasm of colon: Secondary | ICD-10-CM | POA: Diagnosis not present

## 2022-02-16 DIAGNOSIS — R310 Gross hematuria: Secondary | ICD-10-CM | POA: Diagnosis not present

## 2022-02-16 DIAGNOSIS — D509 Iron deficiency anemia, unspecified: Secondary | ICD-10-CM | POA: Diagnosis not present

## 2022-02-16 DIAGNOSIS — Z8349 Family history of other endocrine, nutritional and metabolic diseases: Secondary | ICD-10-CM | POA: Diagnosis not present

## 2022-04-14 DIAGNOSIS — H40013 Open angle with borderline findings, low risk, bilateral: Secondary | ICD-10-CM | POA: Diagnosis not present

## 2022-04-29 DIAGNOSIS — H401131 Primary open-angle glaucoma, bilateral, mild stage: Secondary | ICD-10-CM | POA: Diagnosis not present

## 2022-06-07 DIAGNOSIS — H43813 Vitreous degeneration, bilateral: Secondary | ICD-10-CM | POA: Diagnosis not present

## 2022-06-07 DIAGNOSIS — H401131 Primary open-angle glaucoma, bilateral, mild stage: Secondary | ICD-10-CM | POA: Diagnosis not present

## 2022-06-07 DIAGNOSIS — Z9889 Other specified postprocedural states: Secondary | ICD-10-CM | POA: Diagnosis not present

## 2022-06-07 DIAGNOSIS — H2512 Age-related nuclear cataract, left eye: Secondary | ICD-10-CM | POA: Diagnosis not present

## 2022-07-14 DIAGNOSIS — R059 Cough, unspecified: Secondary | ICD-10-CM | POA: Diagnosis not present

## 2022-07-14 DIAGNOSIS — Z20822 Contact with and (suspected) exposure to covid-19: Secondary | ICD-10-CM | POA: Diagnosis not present

## 2022-07-14 DIAGNOSIS — J029 Acute pharyngitis, unspecified: Secondary | ICD-10-CM | POA: Diagnosis not present

## 2022-07-16 ENCOUNTER — Telehealth: Payer: Self-pay | Admitting: Internal Medicine

## 2022-07-16 NOTE — Telephone Encounter (Signed)
Attempted to call pt but unable to reach and unable to leave a VM. Will try to call back later. 

## 2022-07-21 NOTE — Telephone Encounter (Signed)
Attempted to call pt but unable to reach and unable to leave VM. Due to multiple times trying to call pt without able to reach, per protocol encounter will be closed.

## 2022-08-05 DIAGNOSIS — H401121 Primary open-angle glaucoma, left eye, mild stage: Secondary | ICD-10-CM | POA: Diagnosis not present

## 2022-08-05 DIAGNOSIS — H2512 Age-related nuclear cataract, left eye: Secondary | ICD-10-CM | POA: Diagnosis not present

## 2022-08-18 DIAGNOSIS — H2511 Age-related nuclear cataract, right eye: Secondary | ICD-10-CM | POA: Diagnosis not present

## 2022-08-19 DIAGNOSIS — H401121 Primary open-angle glaucoma, left eye, mild stage: Secondary | ICD-10-CM | POA: Diagnosis not present

## 2022-08-19 DIAGNOSIS — H401111 Primary open-angle glaucoma, right eye, mild stage: Secondary | ICD-10-CM | POA: Diagnosis not present

## 2022-08-19 DIAGNOSIS — H2511 Age-related nuclear cataract, right eye: Secondary | ICD-10-CM | POA: Diagnosis not present

## 2022-11-12 DIAGNOSIS — H401131 Primary open-angle glaucoma, bilateral, mild stage: Secondary | ICD-10-CM | POA: Diagnosis not present

## 2023-04-15 DIAGNOSIS — K580 Irritable bowel syndrome with diarrhea: Secondary | ICD-10-CM | POA: Diagnosis not present

## 2023-04-15 DIAGNOSIS — J449 Chronic obstructive pulmonary disease, unspecified: Secondary | ICD-10-CM | POA: Diagnosis not present

## 2023-04-15 DIAGNOSIS — K921 Melena: Secondary | ICD-10-CM | POA: Diagnosis not present

## 2023-04-15 DIAGNOSIS — J9611 Chronic respiratory failure with hypoxia: Secondary | ICD-10-CM | POA: Diagnosis not present

## 2023-04-15 DIAGNOSIS — M797 Fibromyalgia: Secondary | ICD-10-CM | POA: Diagnosis not present

## 2023-04-15 DIAGNOSIS — R0602 Shortness of breath: Secondary | ICD-10-CM | POA: Diagnosis not present

## 2023-04-15 DIAGNOSIS — M549 Dorsalgia, unspecified: Secondary | ICD-10-CM | POA: Diagnosis not present

## 2023-04-15 DIAGNOSIS — E1122 Type 2 diabetes mellitus with diabetic chronic kidney disease: Secondary | ICD-10-CM | POA: Diagnosis not present

## 2023-04-15 DIAGNOSIS — R5383 Other fatigue: Secondary | ICD-10-CM | POA: Diagnosis not present

## 2023-05-23 DIAGNOSIS — M5416 Radiculopathy, lumbar region: Secondary | ICD-10-CM | POA: Diagnosis not present

## 2023-05-23 DIAGNOSIS — M25562 Pain in left knee: Secondary | ICD-10-CM | POA: Diagnosis not present

## 2023-06-07 DIAGNOSIS — W19XXXA Unspecified fall, initial encounter: Secondary | ICD-10-CM | POA: Diagnosis not present

## 2023-06-07 DIAGNOSIS — M549 Dorsalgia, unspecified: Secondary | ICD-10-CM | POA: Diagnosis not present

## 2023-06-07 DIAGNOSIS — R079 Chest pain, unspecified: Secondary | ICD-10-CM | POA: Diagnosis not present

## 2023-06-07 DIAGNOSIS — R8290 Unspecified abnormal findings in urine: Secondary | ICD-10-CM | POA: Diagnosis not present

## 2023-06-09 DIAGNOSIS — M5416 Radiculopathy, lumbar region: Secondary | ICD-10-CM | POA: Diagnosis not present

## 2023-06-30 DIAGNOSIS — M546 Pain in thoracic spine: Secondary | ICD-10-CM | POA: Diagnosis not present

## 2023-06-30 DIAGNOSIS — M170 Bilateral primary osteoarthritis of knee: Secondary | ICD-10-CM | POA: Diagnosis not present

## 2023-08-15 ENCOUNTER — Encounter (HOSPITAL_COMMUNITY): Payer: Self-pay | Admitting: Radiology

## 2023-08-15 ENCOUNTER — Other Ambulatory Visit: Payer: Self-pay

## 2023-08-15 ENCOUNTER — Emergency Department (HOSPITAL_COMMUNITY): Payer: Medicare PPO

## 2023-08-15 ENCOUNTER — Emergency Department (HOSPITAL_COMMUNITY)
Admission: EM | Admit: 2023-08-15 | Discharge: 2023-08-16 | Disposition: A | Discharge status: Home / Self Care | Payer: Medicare PPO

## 2023-08-15 DIAGNOSIS — N390 Urinary tract infection, site not specified: Secondary | ICD-10-CM | POA: Insufficient documentation

## 2023-08-15 DIAGNOSIS — I6529 Occlusion and stenosis of unspecified carotid artery: Secondary | ICD-10-CM | POA: Diagnosis not present

## 2023-08-15 DIAGNOSIS — R0781 Pleurodynia: Secondary | ICD-10-CM | POA: Diagnosis not present

## 2023-08-15 DIAGNOSIS — Z20822 Contact with and (suspected) exposure to covid-19: Secondary | ICD-10-CM | POA: Diagnosis not present

## 2023-08-15 DIAGNOSIS — M542 Cervicalgia: Secondary | ICD-10-CM | POA: Diagnosis not present

## 2023-08-15 DIAGNOSIS — M546 Pain in thoracic spine: Secondary | ICD-10-CM | POA: Diagnosis not present

## 2023-08-15 DIAGNOSIS — I10 Essential (primary) hypertension: Secondary | ICD-10-CM | POA: Diagnosis not present

## 2023-08-15 DIAGNOSIS — R296 Repeated falls: Secondary | ICD-10-CM | POA: Diagnosis not present

## 2023-08-15 DIAGNOSIS — Z043 Encounter for examination and observation following other accident: Secondary | ICD-10-CM | POA: Diagnosis not present

## 2023-08-15 DIAGNOSIS — R5383 Other fatigue: Secondary | ICD-10-CM | POA: Diagnosis not present

## 2023-08-15 LAB — URINALYSIS, ROUTINE W REFLEX MICROSCOPIC
Bilirubin Urine: NEGATIVE
Glucose, UA: NEGATIVE mg/dL
Ketones, ur: NEGATIVE mg/dL
Nitrite: POSITIVE — AB
Protein, ur: NEGATIVE mg/dL
Specific Gravity, Urine: 1.014 (ref 1.005–1.030)
pH: 5 (ref 5.0–8.0)

## 2023-08-15 LAB — CBC
HCT: 42.5 % (ref 36.0–46.0)
Hemoglobin: 13.5 g/dL (ref 12.0–15.0)
MCH: 27.4 pg (ref 26.0–34.0)
MCHC: 31.8 g/dL (ref 30.0–36.0)
MCV: 86.4 fL (ref 80.0–100.0)
Platelets: 215 10*3/uL (ref 150–400)
RBC: 4.92 MIL/uL (ref 3.87–5.11)
RDW: 15.8 % — ABNORMAL HIGH (ref 11.5–15.5)
WBC: 6.7 10*3/uL (ref 4.0–10.5)
nRBC: 0 % (ref 0.0–0.2)

## 2023-08-15 LAB — BASIC METABOLIC PANEL
Anion gap: 11 (ref 5–15)
BUN: 9 mg/dL (ref 8–23)
CO2: 23 mmol/L (ref 22–32)
Calcium: 9 mg/dL (ref 8.9–10.3)
Chloride: 103 mmol/L (ref 98–111)
Creatinine, Ser: 0.67 mg/dL (ref 0.44–1.00)
GFR, Estimated: >60 mL/min (ref >60)
Glucose, Bld: 101 mg/dL — ABNORMAL HIGH (ref 70–99)
Potassium: 4.7 mmol/L (ref 3.5–5.1)
Sodium: 137 mmol/L (ref 135–145)

## 2023-08-15 LAB — RESP PANEL BY RT-PCR (RSV, FLU A&B, COVID)  RVPGX2
Influenza A by PCR: NEGATIVE
Influenza B by PCR: NEGATIVE
Resp Syncytial Virus by PCR: NEGATIVE
SARS Coronavirus 2 by RT PCR: NEGATIVE

## 2023-08-15 LAB — TROPONIN I (HIGH SENSITIVITY): Troponin I (High Sensitivity): 6 ng/L (ref <18)

## 2023-08-15 MED ORDER — KETOROLAC TROMETHAMINE 15 MG/ML IJ SOLN
15.0000 mg | Freq: Once | INTRAMUSCULAR | Status: AC
  Administered 2023-08-15: 15 mg via INTRAVENOUS
  Filled 2023-08-15: qty 1

## 2023-08-15 MED ORDER — CEPHALEXIN 500 MG PO CAPS
500.0000 mg | ORAL_CAPSULE | Freq: Once | ORAL | Status: AC
  Administered 2023-08-16: 500 mg via ORAL
  Filled 2023-08-15: qty 1

## 2023-08-15 NOTE — ED Triage Notes (Signed)
Pt states she has chronic fatigue due to fibromyalgia but states it has never been so bad she can't stand for more than 10 minutes at a time which is the current situation. Pt states she isn't able to walk and has had multiple falls recently. 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 Pt also complains of back pain from her fall in June as well. These are not new pains and she has already had xrays for these in June.

## 2023-08-15 NOTE — ED Provider Notes (Signed)
[place] EMERGENCY DEPARTMENT AT [facility] Provider Note   CSN: [id number] Arrival date & time: 08/15/23  1651     History {Add pertinent medical, surgical, social history, OB history to HPI:1} No chief complaint on file.   Patricia Sloan is a 77 y.o. female.  77 year old female with past medical history of fibromyalgia and lumbar radiculopathy presenting to the emergency department today with concern for thoracic back pain, neck pain, and head injury.  The patient states that she has frequent falls and fell a few days ago.  She states that she is having ongoing issues with chronic gait instability due to some osteoarthritis in both knees as well as lumbar radiculopathy.  The patient denies any fevers or chills.  She denies any focal weakness, numbness, or tingling.  She states that she has been seeing multiple orthopedists in the past and has had multiple injections and has been worked up for this.  She came today for further evaluation regarding this.  She denies any bowel or bladder dysfunction and has not had any saddle anesthesia.  The history is provided by the patient.       Home Medications Prior to Admission medications   Medication Sig Start Date End Date Taking? Authorizing Provider  albuterol (PROVENTIL HFA;VENTOLIN HFA) 108 (90 BASE) MCG/ACT inhaler Inhale 2 puffs into the lungs every 6 (six) hours as needed for wheezing or shortness of breath.    [provider]  ALPRAZolam Prudy Feeler) 0.5 MG tablet Take 0.5 mg by mouth at bedtime as needed. anxiety    [provider]  diphenhydrAMINE (SOMINEX) 25 MG tablet Take 25 mg by mouth daily as needed. allergies    [provider]  ergocalciferol (VITAMIN D2) 50000 UNITS capsule Take 50,000 Units by mouth once a week. on Fridays    [provider]  ezetimibe-simvastatin (VYTORIN) 10-40 MG tablet Take 1 tablet by mouth daily.    [provider]  famotidine (PEPCID) 20 MG  tablet One at bedtime 11/26/16   Nyoka Cowden, MD  fenofibrate (TRICOR) 145 MG tablet Take 145 mg by mouth daily.    [provider]  fluticasone (FLONASE) 50 MCG/ACT nasal spray Place 2 sprays into the nose daily.    [provider]  HYDROcodone-acetaminophen (VICODIN) 5-500 MG per tablet Take 1 tablet by mouth every 6 (six) hours as needed. For pain    [provider]  hyoscyamine (LEVSIN) 0.125 MG/5ML ELIX Take 0.125 mg by mouth.    [provider]  ketorolac (TORADOL) 10 MG tablet Take 1 tablet (10 mg total) by mouth every 6 (six) hours as needed for pain (do not exceed 40 mg/day ( no greater than 4 pills/day)). 09/17/12   Arthor Captain, PA-C  Multiple Vitamin (MULTIVITAMIN WITH MINERALS) TABS Take 1 tablet by mouth daily.    [provider]  omeprazole (PRILOSEC) 40 MG capsule Take 1 capsule (40 mg total) by mouth daily. 11/26/16   Nyoka Cowden, MD  pregabalin (LYRICA) 75 MG capsule Take 75 mg by mouth 2 (two) times daily.    [provider]  sertraline (ZOLOFT) 100 MG tablet Take 100 mg by mouth daily.    [provider]  simvastatin (ZOCOR) 40 MG tablet Take 40 mg by mouth daily.    [provider]  Tamsulosin HCl (FLOMAX) 0.4 MG CAPS Take 1 capsule (0.4 mg total) by mouth daily. Discontinue after stone passes. 09/17/12   Arthor Captain, PA-C  traMADol Janean Sark) 50  MG tablet Take 50 mg by mouth every 6 (six) hours as needed.    [provider]  vitamin B-12 (CYANOCOBALAMIN) 1000 MCG tablet Take 1,000 mcg by mouth daily.    [provider]      Allergies    Augmentin [amoxicillin-pot clavulanate], Fish oil, Flexeril [cyclobenzaprine], Lovaza [omega-3-acid ethyl esters], Minocycline, Percocet [oxycodone-acetaminophen], and Sulfa antibiotics    Review of Systems   Review of Systems  Constitutional:  Positive for fatigue.  Musculoskeletal:  Positive for arthralgias and back pain.  All other  systems reviewed and are negative.   Physical Exam Updated Vital Signs BP (!) 129/97 (BP Location: Left Arm)   Pulse 78   Temp 97.8 F (36.6 C) (Oral)   Resp 18   Ht 5\' 4"  (1.626 m)   Wt 83 kg   SpO2 100%   BMI 31.41 kg/m  Physical Exam Vitals and nursing note reviewed.   Gen: NAD Eyes: PERRL, EOMI HEENT: no oropharyngeal swelling Neck: Tender over the mid cervical spine with no step-offs or deformities Resp: clear to auscultation bilaterally Card: RRR, no murmurs, rubs, or gallops Abd: nontender, nondistended Extremities: no calf tenderness, no edema MSK: Tender over the thoracic spine with no step-offs or deformities Vascular: 2+ radial pulses bilaterally, 2+ DP pulses bilaterally Neuro: Equal strength and sensation throughout bilateral upper and lower extremities with no dysmetria on finger-to-nose testing, normal patellar and Achilles reflexes bilaterally Skin: no rashes Psyc: acting appropriately   ED Results / Procedures / Treatments   Labs (all labs ordered are listed, but only abnormal results are displayed) Labs Reviewed  BASIC METABOLIC PANEL - Abnormal; Notable for the following components:      Result Value   Glucose, Bld 101 (*)    All other components within normal limits  CBC - Abnormal; Notable for the following components:   RDW 15.8 (*)    All other components within normal limits  URINALYSIS, ROUTINE W REFLEX MICROSCOPIC - Abnormal; Notable for the following components:   APPearance HAZY (*)    Hgb urine dipstick SMALL (*)    Nitrite POSITIVE (*)    Leukocytes,Ua LARGE (*)    Bacteria, UA MANY (*)    All other components within normal limits  RESP PANEL BY RT-PCR (RSV, FLU A&B, COVID)  RVPGX2  TROPONIN I (HIGH SENSITIVITY)    EKG None  Radiology No results found.  Procedures Procedures  {Document cardiac monitor, telemetry assessment procedure when appropriate:1}  Medications Ordered in ED Medications  ketorolac (TORADOL) 15 MG/ML  injection 15 mg (15 mg Intravenous Given 08/15/23 1955)    ED Course/ Medical Decision Making/ A&P   {   Click here for ABCD2, HEART and other calculatorsREFRESH Note before signing :1}                              Medical Decision Making 77 year old female with past medical history of fibromyalgia and sciatica presents the emergency department today with pain in her neck and upper back after a mechanical fall.  The patient did hit her head as well.  I will further evaluate here with a CT scan of her head, cervical spine, and thoracic spine for further evaluation for acute traumatic injuries.  Will also obtain a chest x-ray she is reporting some chest wall pain.  Also obtain EKG and troponin because the patient states that she is "scared about her heart although she is denying any chest  pain currently.  The patient's EKG interpreted by me shows a sinus rhythm with a rate of 68 with normal axis, normal intervals, no significant ST-T changes.  Amount and/or Complexity of Data Reviewed Labs: ordered. Radiology: ordered.  Risk Prescription drug management.   ***  {Document critical care time when appropriate:1} {Document review of labs and clinical decision tools ie heart score, Chads2Vasc2 etc:1}  {Document your independent review of radiology images, and any outside records:1} {Document your discussion with family members, caretakers, and with consultants:1} {Document social determinants of health affecting pt's care:1} {Document your decision making why or why not admission, treatments were needed:1} Final Clinical Impression(s) / ED Diagnoses Final diagnoses:  None    Rx / DC Orders ED Discharge Orders     None

## 2023-08-16 DIAGNOSIS — N390 Urinary tract infection, site not specified: Secondary | ICD-10-CM | POA: Diagnosis not present

## 2023-08-16 MED ORDER — CEPHALEXIN 500 MG PO CAPS: 500.0000 mg | ORAL_CAPSULE | Freq: Four times a day (QID) | ORAL | 0 refills | Status: DC

## 2023-08-16 NOTE — Discharge Instructions (Signed)
Your workup today was reassuring.  Please follow-up with your doctor.  Take the antibiotic for the urinary tract infection.  Return to the ER for worsening symptoms.

## 2023-08-24 DIAGNOSIS — F331 Major depressive disorder, recurrent, moderate: Secondary | ICD-10-CM | POA: Diagnosis not present

## 2023-08-24 DIAGNOSIS — Z9989 Dependence on other enabling machines and devices: Secondary | ICD-10-CM | POA: Diagnosis not present

## 2023-08-24 DIAGNOSIS — R296 Repeated falls: Secondary | ICD-10-CM | POA: Diagnosis not present

## 2023-08-24 DIAGNOSIS — I700 Atherosclerosis of aorta: Secondary | ICD-10-CM | POA: Diagnosis not present

## 2023-08-24 DIAGNOSIS — M549 Dorsalgia, unspecified: Secondary | ICD-10-CM | POA: Diagnosis not present

## 2023-08-24 DIAGNOSIS — R269 Unspecified abnormalities of gait and mobility: Secondary | ICD-10-CM | POA: Diagnosis not present

## 2023-08-24 DIAGNOSIS — N390 Urinary tract infection, site not specified: Secondary | ICD-10-CM | POA: Diagnosis not present

## 2023-08-31 DIAGNOSIS — F419 Anxiety disorder, unspecified: Secondary | ICD-10-CM | POA: Diagnosis not present

## 2023-08-31 DIAGNOSIS — E1122 Type 2 diabetes mellitus with diabetic chronic kidney disease: Secondary | ICD-10-CM | POA: Diagnosis not present

## 2023-08-31 DIAGNOSIS — I700 Atherosclerosis of aorta: Secondary | ICD-10-CM | POA: Diagnosis not present

## 2023-08-31 DIAGNOSIS — J449 Chronic obstructive pulmonary disease, unspecified: Secondary | ICD-10-CM | POA: Diagnosis not present

## 2023-08-31 DIAGNOSIS — E611 Iron deficiency: Secondary | ICD-10-CM | POA: Diagnosis not present

## 2023-08-31 DIAGNOSIS — F331 Major depressive disorder, recurrent, moderate: Secondary | ICD-10-CM | POA: Diagnosis not present

## 2023-08-31 DIAGNOSIS — J9611 Chronic respiratory failure with hypoxia: Secondary | ICD-10-CM | POA: Diagnosis not present

## 2023-08-31 DIAGNOSIS — M170 Bilateral primary osteoarthritis of knee: Secondary | ICD-10-CM | POA: Diagnosis not present

## 2023-08-31 DIAGNOSIS — N183 Chronic kidney disease, stage 3 unspecified: Secondary | ICD-10-CM | POA: Diagnosis not present

## 2023-09-06 DIAGNOSIS — F419 Anxiety disorder, unspecified: Secondary | ICD-10-CM | POA: Diagnosis not present

## 2023-09-06 DIAGNOSIS — J449 Chronic obstructive pulmonary disease, unspecified: Secondary | ICD-10-CM | POA: Diagnosis not present

## 2023-09-06 DIAGNOSIS — E611 Iron deficiency: Secondary | ICD-10-CM | POA: Diagnosis not present

## 2023-09-06 DIAGNOSIS — I700 Atherosclerosis of aorta: Secondary | ICD-10-CM | POA: Diagnosis not present

## 2023-09-06 DIAGNOSIS — J9611 Chronic respiratory failure with hypoxia: Secondary | ICD-10-CM | POA: Diagnosis not present

## 2023-09-06 DIAGNOSIS — M170 Bilateral primary osteoarthritis of knee: Secondary | ICD-10-CM | POA: Diagnosis not present

## 2023-09-06 DIAGNOSIS — F331 Major depressive disorder, recurrent, moderate: Secondary | ICD-10-CM | POA: Diagnosis not present

## 2023-09-06 DIAGNOSIS — E1122 Type 2 diabetes mellitus with diabetic chronic kidney disease: Secondary | ICD-10-CM | POA: Diagnosis not present

## 2023-09-06 DIAGNOSIS — N183 Chronic kidney disease, stage 3 unspecified: Secondary | ICD-10-CM | POA: Diagnosis not present

## 2023-09-09 DIAGNOSIS — N183 Chronic kidney disease, stage 3 unspecified: Secondary | ICD-10-CM | POA: Diagnosis not present

## 2023-09-09 DIAGNOSIS — J9611 Chronic respiratory failure with hypoxia: Secondary | ICD-10-CM | POA: Diagnosis not present

## 2023-09-09 DIAGNOSIS — F331 Major depressive disorder, recurrent, moderate: Secondary | ICD-10-CM | POA: Diagnosis not present

## 2023-09-09 DIAGNOSIS — E611 Iron deficiency: Secondary | ICD-10-CM | POA: Diagnosis not present

## 2023-09-09 DIAGNOSIS — I700 Atherosclerosis of aorta: Secondary | ICD-10-CM | POA: Diagnosis not present

## 2023-09-09 DIAGNOSIS — F419 Anxiety disorder, unspecified: Secondary | ICD-10-CM | POA: Diagnosis not present

## 2023-09-09 DIAGNOSIS — J449 Chronic obstructive pulmonary disease, unspecified: Secondary | ICD-10-CM | POA: Diagnosis not present

## 2023-09-09 DIAGNOSIS — M170 Bilateral primary osteoarthritis of knee: Secondary | ICD-10-CM | POA: Diagnosis not present

## 2023-09-09 DIAGNOSIS — E1122 Type 2 diabetes mellitus with diabetic chronic kidney disease: Secondary | ICD-10-CM | POA: Diagnosis not present

## 2023-09-20 DIAGNOSIS — M170 Bilateral primary osteoarthritis of knee: Secondary | ICD-10-CM | POA: Diagnosis not present

## 2023-09-20 DIAGNOSIS — N183 Chronic kidney disease, stage 3 unspecified: Secondary | ICD-10-CM | POA: Diagnosis not present

## 2023-09-20 DIAGNOSIS — E1122 Type 2 diabetes mellitus with diabetic chronic kidney disease: Secondary | ICD-10-CM | POA: Diagnosis not present

## 2023-09-20 DIAGNOSIS — E611 Iron deficiency: Secondary | ICD-10-CM | POA: Diagnosis not present

## 2023-09-20 DIAGNOSIS — F419 Anxiety disorder, unspecified: Secondary | ICD-10-CM | POA: Diagnosis not present

## 2023-09-20 DIAGNOSIS — I700 Atherosclerosis of aorta: Secondary | ICD-10-CM | POA: Diagnosis not present

## 2023-09-20 DIAGNOSIS — J449 Chronic obstructive pulmonary disease, unspecified: Secondary | ICD-10-CM | POA: Diagnosis not present

## 2023-09-20 DIAGNOSIS — J9611 Chronic respiratory failure with hypoxia: Secondary | ICD-10-CM | POA: Diagnosis not present

## 2023-09-20 DIAGNOSIS — F331 Major depressive disorder, recurrent, moderate: Secondary | ICD-10-CM | POA: Diagnosis not present

## 2023-09-26 DIAGNOSIS — N183 Chronic kidney disease, stage 3 unspecified: Secondary | ICD-10-CM | POA: Diagnosis not present

## 2023-09-26 DIAGNOSIS — J449 Chronic obstructive pulmonary disease, unspecified: Secondary | ICD-10-CM | POA: Diagnosis not present

## 2023-09-26 DIAGNOSIS — E611 Iron deficiency: Secondary | ICD-10-CM | POA: Diagnosis not present

## 2023-09-26 DIAGNOSIS — F331 Major depressive disorder, recurrent, moderate: Secondary | ICD-10-CM | POA: Diagnosis not present

## 2023-09-26 DIAGNOSIS — F419 Anxiety disorder, unspecified: Secondary | ICD-10-CM | POA: Diagnosis not present

## 2023-09-26 DIAGNOSIS — M170 Bilateral primary osteoarthritis of knee: Secondary | ICD-10-CM | POA: Diagnosis not present

## 2023-09-26 DIAGNOSIS — I700 Atherosclerosis of aorta: Secondary | ICD-10-CM | POA: Diagnosis not present

## 2023-09-26 DIAGNOSIS — E1122 Type 2 diabetes mellitus with diabetic chronic kidney disease: Secondary | ICD-10-CM | POA: Diagnosis not present

## 2023-09-26 DIAGNOSIS — J9611 Chronic respiratory failure with hypoxia: Secondary | ICD-10-CM | POA: Diagnosis not present

## 2023-09-30 DIAGNOSIS — E1122 Type 2 diabetes mellitus with diabetic chronic kidney disease: Secondary | ICD-10-CM | POA: Diagnosis not present

## 2023-09-30 DIAGNOSIS — F419 Anxiety disorder, unspecified: Secondary | ICD-10-CM | POA: Diagnosis not present

## 2023-09-30 DIAGNOSIS — J9611 Chronic respiratory failure with hypoxia: Secondary | ICD-10-CM | POA: Diagnosis not present

## 2023-09-30 DIAGNOSIS — I700 Atherosclerosis of aorta: Secondary | ICD-10-CM | POA: Diagnosis not present

## 2023-09-30 DIAGNOSIS — E611 Iron deficiency: Secondary | ICD-10-CM | POA: Diagnosis not present

## 2023-09-30 DIAGNOSIS — N183 Chronic kidney disease, stage 3 unspecified: Secondary | ICD-10-CM | POA: Diagnosis not present

## 2023-09-30 DIAGNOSIS — J449 Chronic obstructive pulmonary disease, unspecified: Secondary | ICD-10-CM | POA: Diagnosis not present

## 2023-09-30 DIAGNOSIS — F331 Major depressive disorder, recurrent, moderate: Secondary | ICD-10-CM | POA: Diagnosis not present

## 2023-09-30 DIAGNOSIS — M170 Bilateral primary osteoarthritis of knee: Secondary | ICD-10-CM | POA: Diagnosis not present

## 2023-10-04 DIAGNOSIS — M170 Bilateral primary osteoarthritis of knee: Secondary | ICD-10-CM | POA: Diagnosis not present

## 2023-10-04 DIAGNOSIS — F331 Major depressive disorder, recurrent, moderate: Secondary | ICD-10-CM | POA: Diagnosis not present

## 2023-10-04 DIAGNOSIS — J9611 Chronic respiratory failure with hypoxia: Secondary | ICD-10-CM | POA: Diagnosis not present

## 2023-10-04 DIAGNOSIS — E1122 Type 2 diabetes mellitus with diabetic chronic kidney disease: Secondary | ICD-10-CM | POA: Diagnosis not present

## 2023-10-04 DIAGNOSIS — E611 Iron deficiency: Secondary | ICD-10-CM | POA: Diagnosis not present

## 2023-10-04 DIAGNOSIS — I700 Atherosclerosis of aorta: Secondary | ICD-10-CM | POA: Diagnosis not present

## 2023-10-04 DIAGNOSIS — F419 Anxiety disorder, unspecified: Secondary | ICD-10-CM | POA: Diagnosis not present

## 2023-10-04 DIAGNOSIS — N183 Chronic kidney disease, stage 3 unspecified: Secondary | ICD-10-CM | POA: Diagnosis not present

## 2023-10-04 DIAGNOSIS — J449 Chronic obstructive pulmonary disease, unspecified: Secondary | ICD-10-CM | POA: Diagnosis not present

## 2023-10-19 DIAGNOSIS — F419 Anxiety disorder, unspecified: Secondary | ICD-10-CM | POA: Diagnosis not present

## 2023-10-19 DIAGNOSIS — N183 Chronic kidney disease, stage 3 unspecified: Secondary | ICD-10-CM | POA: Diagnosis not present

## 2023-10-19 DIAGNOSIS — E611 Iron deficiency: Secondary | ICD-10-CM | POA: Diagnosis not present

## 2023-10-19 DIAGNOSIS — I700 Atherosclerosis of aorta: Secondary | ICD-10-CM | POA: Diagnosis not present

## 2023-10-19 DIAGNOSIS — J449 Chronic obstructive pulmonary disease, unspecified: Secondary | ICD-10-CM | POA: Diagnosis not present

## 2023-10-19 DIAGNOSIS — J9611 Chronic respiratory failure with hypoxia: Secondary | ICD-10-CM | POA: Diagnosis not present

## 2023-10-19 DIAGNOSIS — E1122 Type 2 diabetes mellitus with diabetic chronic kidney disease: Secondary | ICD-10-CM | POA: Diagnosis not present

## 2023-10-19 DIAGNOSIS — M170 Bilateral primary osteoarthritis of knee: Secondary | ICD-10-CM | POA: Diagnosis not present

## 2023-10-19 DIAGNOSIS — F331 Major depressive disorder, recurrent, moderate: Secondary | ICD-10-CM | POA: Diagnosis not present

## 2023-10-27 DIAGNOSIS — M170 Bilateral primary osteoarthritis of knee: Secondary | ICD-10-CM | POA: Diagnosis not present

## 2023-10-27 DIAGNOSIS — J449 Chronic obstructive pulmonary disease, unspecified: Secondary | ICD-10-CM | POA: Diagnosis not present

## 2023-10-27 DIAGNOSIS — N183 Chronic kidney disease, stage 3 unspecified: Secondary | ICD-10-CM | POA: Diagnosis not present

## 2023-10-27 DIAGNOSIS — F419 Anxiety disorder, unspecified: Secondary | ICD-10-CM | POA: Diagnosis not present

## 2023-10-27 DIAGNOSIS — I700 Atherosclerosis of aorta: Secondary | ICD-10-CM | POA: Diagnosis not present

## 2023-10-27 DIAGNOSIS — E1122 Type 2 diabetes mellitus with diabetic chronic kidney disease: Secondary | ICD-10-CM | POA: Diagnosis not present

## 2023-10-27 DIAGNOSIS — E611 Iron deficiency: Secondary | ICD-10-CM | POA: Diagnosis not present

## 2023-10-27 DIAGNOSIS — F331 Major depressive disorder, recurrent, moderate: Secondary | ICD-10-CM | POA: Diagnosis not present

## 2023-10-27 DIAGNOSIS — J9611 Chronic respiratory failure with hypoxia: Secondary | ICD-10-CM | POA: Diagnosis not present

## 2023-11-19 DIAGNOSIS — S40912A Unspecified superficial injury of left shoulder, initial encounter: Secondary | ICD-10-CM | POA: Diagnosis not present

## 2023-11-19 DIAGNOSIS — W108XXA Fall (on) (from) other stairs and steps, initial encounter: Secondary | ICD-10-CM | POA: Diagnosis not present

## 2023-11-29 ENCOUNTER — Telehealth: Payer: Self-pay | Admitting: Internal Medicine

## 2023-11-29 NOTE — Telephone Encounter (Signed)
Patient would like oxygen removed from her house. It hasn't been working properly and has been over heating. [employer] told her that she needed at note from her doctor. Please call and advise [phone]   Patient had a fall back in June and has back issues and thinks she may have hurt her lungs in the process.

## 2023-12-01 NOTE — Telephone Encounter (Signed)
Patient is calling in reference to her oxygen. She needs a new one to replace her old one. It over heats and she wants something that is smaller and possibly portable.

## 2023-12-01 NOTE — Telephone Encounter (Signed)
Patient would like to speak with a nurse [phone]

## 2023-12-02 NOTE — Telephone Encounter (Signed)
Patient last seen 02/10/2021. Can not order until after appt 01/2024.  Lm for patient.

## 2023-12-27 DIAGNOSIS — M549 Dorsalgia, unspecified: Secondary | ICD-10-CM | POA: Diagnosis not present

## 2023-12-27 DIAGNOSIS — R269 Unspecified abnormalities of gait and mobility: Secondary | ICD-10-CM | POA: Diagnosis not present

## 2023-12-27 DIAGNOSIS — R350 Frequency of micturition: Secondary | ICD-10-CM | POA: Diagnosis not present

## 2023-12-27 DIAGNOSIS — J449 Chronic obstructive pulmonary disease, unspecified: Secondary | ICD-10-CM | POA: Diagnosis not present

## 2023-12-27 DIAGNOSIS — M797 Fibromyalgia: Secondary | ICD-10-CM | POA: Diagnosis not present

## 2023-12-27 DIAGNOSIS — Z23 Encounter for immunization: Secondary | ICD-10-CM | POA: Diagnosis not present

## 2023-12-27 DIAGNOSIS — G720 Drug-induced myopathy: Secondary | ICD-10-CM | POA: Diagnosis not present

## 2023-12-27 DIAGNOSIS — R296 Repeated falls: Secondary | ICD-10-CM | POA: Diagnosis not present

## 2023-12-27 DIAGNOSIS — F331 Major depressive disorder, recurrent, moderate: Secondary | ICD-10-CM | POA: Diagnosis not present

## 2023-12-27 DIAGNOSIS — R7303 Prediabetes: Secondary | ICD-10-CM | POA: Diagnosis not present

## 2024-01-09 DIAGNOSIS — G4700 Insomnia, unspecified: Secondary | ICD-10-CM | POA: Diagnosis not present

## 2024-01-09 DIAGNOSIS — J40 Bronchitis, not specified as acute or chronic: Secondary | ICD-10-CM | POA: Diagnosis not present

## 2024-01-09 DIAGNOSIS — F331 Major depressive disorder, recurrent, moderate: Secondary | ICD-10-CM | POA: Diagnosis not present

## 2024-01-09 DIAGNOSIS — Z03818 Encounter for observation for suspected exposure to other biological agents ruled out: Secondary | ICD-10-CM | POA: Diagnosis not present

## 2024-01-09 DIAGNOSIS — K219 Gastro-esophageal reflux disease without esophagitis: Secondary | ICD-10-CM | POA: Diagnosis not present

## 2024-01-09 DIAGNOSIS — E785 Hyperlipidemia, unspecified: Secondary | ICD-10-CM | POA: Diagnosis not present

## 2024-01-09 DIAGNOSIS — R7303 Prediabetes: Secondary | ICD-10-CM | POA: Diagnosis not present

## 2024-01-09 DIAGNOSIS — E611 Iron deficiency: Secondary | ICD-10-CM | POA: Diagnosis not present

## 2024-01-09 DIAGNOSIS — Z0000 Encounter for general adult medical examination without abnormal findings: Secondary | ICD-10-CM | POA: Diagnosis not present

## 2024-01-09 DIAGNOSIS — J9611 Chronic respiratory failure with hypoxia: Secondary | ICD-10-CM | POA: Diagnosis not present

## 2024-01-09 DIAGNOSIS — F17290 Nicotine dependence, other tobacco product, uncomplicated: Secondary | ICD-10-CM | POA: Diagnosis not present

## 2024-01-09 DIAGNOSIS — Z1331 Encounter for screening for depression: Secondary | ICD-10-CM | POA: Diagnosis not present

## 2024-01-30 NOTE — Progress Notes (Unsigned)
Subjective:   Patient ID: Patricia Sloan, female    DOB: October 26, 1946    MRN: [id number]    Brief patient profile:  66  yowf quit smoking 2013 with pattern of cough/ congestion/sinus infections x around 2000 with freq need for abx/pred multiple ov's and then some better for months to maybe a year before relapsing and then started on advair/ alb 2015 and still having same pattern/ similar symptoms  so referred to pulmonary clinic 11/26/2016 by Dr  Shaune Pollack.   History of Present Illness  11/26/2016 1st [facility] office visit/ Billie Intriago   Chief Complaint  Patient presents with   Pulmonary Consult    Dr. Kevan Ny referred pt, has COPD, always SOB, caough for 6 months, uses Advair, nebulizer, and albuterol level, white mucus when coughing  sleeps poorly due to fibromyalgia and cough > sob maybre 50 % better p nebulizer despite maint rx with  advair dpi doubled dose x one month ? Worse cough > sob since increased bit mucus is minimal vol/ white Limited walking due to knee > sob  "just at the end" of a typical flare already received abx/ prednisone and still can't sleep thru the night s coughing On ppi qam maint and flonase as well    rec Stop advair Only use your albuterol as a rescue medication   Double the prilosec to where you take 40 mg(omeprazole is the generic)   Take 30-60 min before first meal of the day and Pepcid ac 20 mg daily until return  GERD (REFLUX)  Please see patient coordinator before you leave today  to schedule sinus CT Please remember to go to the lab and x-ray department downstairs for your tests - we will call you with the results when they are available.  Please schedule a follow up office visit in 4 weeks, sooner if needed - bring all your medications with you on return      12/27/2016  f/u ov/Sabastien Tyler re:   uacs vs cough variant asthma/ did not bring meds as req Chief Complaint  Patient presents with   Follow-up    4 week follow up COPD, has a little cough  today, she states she is doing better   no saba at all x > 24 hours and overall much better off advair but "I know it's allergies" anyway  Fatigue and aches/ not limited by doe at all / sleeping thru the noct now s any cough / did not follow instructions re gerd rx and just taking prilosec 20 mg with bfast at this point and no pepcid at all ("I've read all about these meds and I don't think they are safe") rec If any worse cough or breathing  >> Try omeprazole 40 mg Take 30-60 min before first meal of the day and add Pepcid 20 mg at bedtime and do this one week  before surgery  GERD diet . If not satisfied return to this office with all medications in hand and we will regroup.      09/12/2019 NP televisit  Patient contacted today for a follow-up via telephone visit.  Patient reports experiencing shortness of breath and cough at night. States that she cannot sleep through the night due to her breathing symptoms. She feels she may need oxygen. She uses her Albuterol rescue inhaler 1-2 times a night. Denies apneic periods.   10/02/2019 [name] NP ov:   ONO on 09/24/19 showed oxygen desaturation to low 77% with baseline 88% on room air.Wearing  2L at night. States that she can not function d/t her dyspnea and chronic fatigue. She gets short of breath with ADLS and showering. States that her dyspnea is so bad she has to use power chair at store to shop. Uses albuterol rescue inhaler once daily with improvement. Reports improvement in cough after stopping Advair. Feels her symptoms are mainly related to chronic fatigue syndrome. She is not interested in any form of rehab. Testing reviewed: 10/02/2019 Ambulatory walk test: Completed 3 laps at average pace, oxygen level 97% room air. Stopped half way d/t leg fatigue.  Ambulatory oxygen testing showed that you do not require oxygen during day time - Wear 2L oxygen at night - Continue Albuterol two puffs every 4-6 hours for shortness of breath/wheezing  - CXR  and labs today (ordered) - Pulmonary function testing  - Echocardiogram re: dyspnea     12/03/2019  f/u ov/Sister Carbone re: GOLD 0  Chief Complaint  Patient presents with   Follow-up    Patient reports that she's doing well. She reports that wearing oxygen at night has helped her alot.   Dyspnea:  Room to room limited by back  with a cane  Cough: rattle /clear  Sleeping: R side down bed flat  SABA use: every other  02: 2lpm hs  rec We need to order you a water chamber for your 02 to provide you humidity at bedtime. Follow up will be in 09/2020   to certify for 02 to see Buelah Manis.   02/10/2021  f/u ov/Sendy Pluta re:  GOLD 0 /  Chief Complaint  Patient presents with   Follow-up    Pt states she got sick in Jan 2022- fatigue, cough and sinus pressure, fever. She tested neg for covid. She states that her symptoms have since resolved. She needs to recert for nocturnal o2.    Dyspnea:  50 ft with cane due to fatigue R knee and back pain  Cough: no longer / nose stuffy still since Jan 2022 / has seen ENT in past and has nose bleeds as well but has not returne Sleeping:   bed is flat 2 pillows  SABA use: not using now  02: 2lpm hs humidified  Covid status:   vax x 3  Rec  Make sure you check your oxygen saturation at your highest level of activity to be sure it stays over 90%  Only use your albuterol as a rescue medication Ok also to Try albuterol 15 min before an activity that you know would make you short of breath    Late add ? ild / chf on cxr so rec bnp,esr, cbc with diff     02/01/2024  f/u ov/Shondrika Hoque re: GOLD 0 copd maint on  twice a week  Chief Complaint  Patient presents with   Follow-up    SOB with exertion.  Would like to get a POC  Dyspnea:  room to room in house with rollator more limited by pain than doe  Cough: none  Sleeping: 30 degrees hob with 2 pillows   SABA use: avg 2 x weekly  02: not using 02 hs /  Lung cancer screening :  declined    No obvious day to day or  daytime variability or assoc excess/ purulent sputum or mucus plugs or hemoptysis or cp or chest tightness, subjective wheeze or overt sinus or hb symptoms.    Also denies any obvious fluctuation of symptoms with weather or environmental changes or other aggravating or alleviating factors  except as outlined above   No unusual exposure hx or h/o childhood pna/ asthma or knowledge of premature birth.  Current Allergies, Complete Past Medical History, Past Surgical History, Family History, and Social History were reviewed in [employer] record.  ROS  The following are not active complaints unless bolded Hoarseness, sore throat, dysphagia, dental problems, itching, sneezing,  nasal congestion or discharge of excess mucus or purulent secretions, ear ache,   fever, chills, sweats, unintended wt loss or wt gain, classically pleuritic or exertional cp,  orthopnea pnd or arm/hand swelling  or leg swelling, presyncope, palpitations, abdominal pain, anorexia, nausea, vomiting, diarrhea  or change in bowel habits or change in bladder habits, change in stools or change in urine, dysuria, hematuria,  rash, arthralgias, visual complaints, headache, numbness, weakness or ataxia or problems with walking or coordination,  change in mood or  memory.        Current Meds  Medication Sig   albuterol (PROVENTIL HFA;VENTOLIN HFA) 108 (90 BASE) MCG/ACT inhaler Inhale 2 puffs into the lungs every 6 (six) hours as needed for wheezing or shortness of breath.   ALPRAZolam (XANAX) 0.5 MG tablet Take 0.5 mg by mouth at bedtime as needed. anxiety   diphenhydrAMINE (SOMINEX) 25 MG tablet Take 25 mg by mouth daily as needed. allergies   ergocalciferol (VITAMIN D2) 50000 UNITS capsule Take 50,000 Units by mouth once a week. on Fridays   ezetimibe-simvastatin (VYTORIN) 10-40 MG tablet Take 1 tablet by mouth daily.   famotidine (PEPCID) 20 MG tablet One at bedtime   fenofibrate (TRICOR) 145 MG tablet Take 145  mg by mouth daily.   fluticasone (FLONASE) 50 MCG/ACT nasal spray Place 2 sprays into the nose daily.   HYDROcodone-acetaminophen (VICODIN) 5-500 MG per tablet Take 1 tablet by mouth every 6 (six) hours as needed. For pain   hyoscyamine (LEVSIN) 0.125 MG/5ML ELIX Take 0.125 mg by mouth.   ketorolac (TORADOL) 10 MG tablet Take 1 tablet (10 mg total) by mouth every 6 (six) hours as needed for pain (do not exceed 40 mg/day ( no greater than 4 pills/day)).   Multiple Vitamin (MULTIVITAMIN WITH MINERALS) TABS Take 1 tablet by mouth daily.   omeprazole (PRILOSEC) 40 MG capsule Take 1 capsule (40 mg total) by mouth daily.   pregabalin (LYRICA) 75 MG capsule Take 75 mg by mouth 2 (two) times daily.   sertraline (ZOLOFT) 100 MG tablet Take 100 mg by mouth daily.   simvastatin (ZOCOR) 40 MG tablet Take 40 mg by mouth daily.   Tamsulosin HCl (FLOMAX) 0.4 MG CAPS Take 1 capsule (0.4 mg total) by mouth daily. Discontinue after stone passes.   traMADol (ULTRAM) 50 MG tablet Take 50 mg by mouth every 6 (six) hours as needed.   vitamin B-12 (CYANOCOBALAMIN) 1000 MCG tablet Take 1,000 mcg by mouth daily.                Objective:   Physical Exam  Wts  02/01/2024       169  02/10/2021         183 12/03/2019     179  12/27/2016      196   11/26/16 197 lb 12.8 oz (89.7 kg)  11/19/16 195 lb (88.5 kg)  02/12/15 201 lb (91.2 kg)    Vital signs reviewed  02/01/2024  - Note at rest 02 sats  94% on RA   General appearance:    amb wf using rollator    HEENT :  Oropharynx  clear      Nasal turbinates nl    NECK :  without  apparent JVD/ palpable Nodes/TM    LUNGS: no acc muscle use,  Nl contour chest which is clear to A and P bilaterally without cough on insp or exp maneuvers   CV:  RRR  no s3 or murmur or increase in P2, and no edema   ABD:  soft and nontender   MS:    ext warm without deformities Or obvious joint restrictions  calf tenderness, cyanosis or clubbing    SKIN: warm and dry without  lesions    NEURO:  alert, approp, nl sensorium with  no motor or cerebellar deficits apparent.        I personally reviewed images and agree with radiology impression as follows:  CXR:   portable 08/15/23  No active disease.        Assessment & Plan:

## 2024-02-01 ENCOUNTER — Ambulatory Visit: Payer: Medicare PPO | Admitting: Internal Medicine

## 2024-02-01 ENCOUNTER — Encounter: Payer: Self-pay | Admitting: Internal Medicine

## 2024-02-01 VITALS — BP 124/60 | HR 84 | Temp 98.3°F | Ht 64.0 in | Wt 169.0 lb

## 2024-02-01 DIAGNOSIS — J449 Chronic obstructive pulmonary disease, unspecified: Secondary | ICD-10-CM

## 2024-02-01 NOTE — Patient Instructions (Signed)
Make sure you check your oxygen saturation at your highest level of activity(NOT after you stop)  to be sure it stays over 90% and keep track of it at least once a week, more often if breathing getting worse, and let me know if losing ground. (Collect the dots to connect the dots approach)   Also  Ok to try albuterol 15 min before an activity (on alternating days with nebulizer the inhaler or nothing )  that you know would usually make you short of breath and see if it makes any difference and if makes none then don't take albuterol after activity unless you can't catch your breath as this means it's the resting that helps, not the albuterol.  Pulmonary follow up is as needed

## 2024-02-01 NOTE — Assessment & Plan Note (Addendum)
Quit smoking 2013 Spirometry 11/26/2016  FEV1 2.15 (90%)  Ratio 70 with only upper portion of f/v loop  truncated  on avdair ? 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 Continued dyspnea, ambulatory O2 97% RA.  - PFT's 12/03/2019 wnl x for erv 15% c/w obesity effects  - 02/01/2024  After extensive coaching inhaler device,  effectiveness =    75%  - 02/01/2024   walked at slow pace with a walking stick. Patient able to walk one lap. Patient c/o fatigue and was unable to walk any further laps with lowest sats 93% RA  Not really limited by doe but multiple orthopedic / conditioning issues so be can be treated as a Group A with prn saba:  Re SABA :  I spent extra time with pt today reviewing appropriate use of albuterol for prn use on exertion with the following points: 1) saba is for relief of sob that does not improve by walking a slower pace or resting but rather if the pt does not improve after trying this first. 2) If the pt is convinced, as many are, that saba helps recover from activity faster then it's easy to tell if this is the case by re-challenging : ie stop, take the inhaler, then p 5 minutes try the exact same activity (intensity of workload) that just caused the symptoms and see if they are substantially diminished or not after saba 3) if there is an activity that reproducibly causes the symptoms, try the saba 15 min before the activity on alternate days   If in fact the saba really does help, then fine to continue to use it prn but advised may need to look closer at the maintenance regimen being used to achieve better control of airways disease with exertion.   Pulmonary f/u is prn      Each maintenance medication was reviewed in detail including emphasizing most importantly the difference between maintenance and prns and under what circumstances  the prns are to be triggered using an action plan format where appropriate.  Total time for H and P, chart review, counseling, reviewing hfa/ pulse ox  device(s) , directly observing portions of ambulatory 02 saturation study/ and generating customized AVS unique to this office visit / same day charting = 33 min summary final f/u ov

## 2024-02-08 DIAGNOSIS — M797 Fibromyalgia: Secondary | ICD-10-CM | POA: Diagnosis not present

## 2024-02-08 DIAGNOSIS — Z9181 History of falling: Secondary | ICD-10-CM | POA: Diagnosis not present

## 2024-02-08 DIAGNOSIS — G8929 Other chronic pain: Secondary | ICD-10-CM | POA: Diagnosis not present

## 2024-02-08 DIAGNOSIS — M549 Dorsalgia, unspecified: Secondary | ICD-10-CM | POA: Diagnosis not present

## 2024-02-08 DIAGNOSIS — F331 Major depressive disorder, recurrent, moderate: Secondary | ICD-10-CM | POA: Diagnosis not present

## 2024-03-09 DIAGNOSIS — R269 Unspecified abnormalities of gait and mobility: Secondary | ICD-10-CM | POA: Diagnosis not present

## 2024-03-09 DIAGNOSIS — Z882 Allergy status to sulfonamides status: Secondary | ICD-10-CM | POA: Diagnosis not present

## 2024-03-09 DIAGNOSIS — E559 Vitamin D deficiency, unspecified: Secondary | ICD-10-CM | POA: Diagnosis not present

## 2024-03-09 DIAGNOSIS — J9610 Chronic respiratory failure, unspecified whether with hypoxia or hypercapnia: Secondary | ICD-10-CM | POA: Diagnosis not present

## 2024-03-09 DIAGNOSIS — F411 Generalized anxiety disorder: Secondary | ICD-10-CM | POA: Diagnosis not present

## 2024-03-09 DIAGNOSIS — J301 Allergic rhinitis due to pollen: Secondary | ICD-10-CM | POA: Diagnosis not present

## 2024-03-09 DIAGNOSIS — I2510 Atherosclerotic heart disease of native coronary artery without angina pectoris: Secondary | ICD-10-CM | POA: Diagnosis not present

## 2024-03-09 DIAGNOSIS — Z818 Family history of other mental and behavioral disorders: Secondary | ICD-10-CM | POA: Diagnosis not present

## 2024-03-09 DIAGNOSIS — K589 Irritable bowel syndrome without diarrhea: Secondary | ICD-10-CM | POA: Diagnosis not present

## 2024-03-09 DIAGNOSIS — K219 Gastro-esophageal reflux disease without esophagitis: Secondary | ICD-10-CM | POA: Diagnosis not present

## 2024-03-09 DIAGNOSIS — Z885 Allergy status to narcotic agent status: Secondary | ICD-10-CM | POA: Diagnosis not present

## 2024-03-09 DIAGNOSIS — E785 Hyperlipidemia, unspecified: Secondary | ICD-10-CM | POA: Diagnosis not present

## 2024-03-09 DIAGNOSIS — F321 Major depressive disorder, single episode, moderate: Secondary | ICD-10-CM | POA: Diagnosis not present

## 2024-03-09 DIAGNOSIS — I10 Essential (primary) hypertension: Secondary | ICD-10-CM | POA: Diagnosis not present

## 2024-03-09 DIAGNOSIS — Z888 Allergy status to other drugs, medicaments and biological substances status: Secondary | ICD-10-CM | POA: Diagnosis not present

## 2024-03-09 DIAGNOSIS — M1990 Unspecified osteoarthritis, unspecified site: Secondary | ICD-10-CM | POA: Diagnosis not present

## 2024-03-09 DIAGNOSIS — Z880 Allergy status to penicillin: Secondary | ICD-10-CM | POA: Diagnosis not present

## 2024-03-09 DIAGNOSIS — R32 Unspecified urinary incontinence: Secondary | ICD-10-CM | POA: Diagnosis not present

## 2024-07-02 DIAGNOSIS — H401131 Primary open-angle glaucoma, bilateral, mild stage: Secondary | ICD-10-CM | POA: Diagnosis not present

## 2024-07-10 DIAGNOSIS — H26493 Other secondary cataract, bilateral: Secondary | ICD-10-CM | POA: Diagnosis not present

## 2024-07-30 DIAGNOSIS — H26493 Other secondary cataract, bilateral: Secondary | ICD-10-CM | POA: Diagnosis not present
